# Patient Record
Sex: Female | Born: 1960 | Race: White | Hispanic: No | Marital: Single | Smoking: Former smoker
Health system: Southern US, Community
[De-identification: ages and names within clinical notes are randomized; demographics above are authoritative.]

## PROBLEM LIST (undated history)

## (undated) DIAGNOSIS — K219 Gastro-esophageal reflux disease without esophagitis: Secondary | ICD-10-CM

## (undated) DIAGNOSIS — E785 Hyperlipidemia, unspecified: Secondary | ICD-10-CM

## (undated) DIAGNOSIS — F32A Depression, unspecified: Secondary | ICD-10-CM

## (undated) DIAGNOSIS — F419 Anxiety disorder, unspecified: Secondary | ICD-10-CM

## (undated) DIAGNOSIS — F329 Major depressive disorder, single episode, unspecified: Secondary | ICD-10-CM

## (undated) HISTORY — DX: Gastro-esophageal reflux disease without esophagitis: K21.9

## (undated) HISTORY — DX: Depression, unspecified: F32.A

## (undated) HISTORY — DX: Anxiety disorder, unspecified: F41.9

## (undated) HISTORY — DX: Hyperlipidemia, unspecified: E78.5

## (undated) HISTORY — DX: Major depressive disorder, single episode, unspecified: F32.9

---

## 1998-05-20 DIAGNOSIS — F419 Anxiety disorder, unspecified: Secondary | ICD-10-CM | POA: Insufficient documentation

## 2005-05-20 DIAGNOSIS — F32A Depression, unspecified: Secondary | ICD-10-CM | POA: Insufficient documentation

## 2005-05-20 DIAGNOSIS — F329 Major depressive disorder, single episode, unspecified: Secondary | ICD-10-CM | POA: Insufficient documentation

## 2006-01-14 DIAGNOSIS — G4700 Insomnia, unspecified: Secondary | ICD-10-CM | POA: Insufficient documentation

## 2006-04-08 DIAGNOSIS — N63 Unspecified lump in unspecified breast: Secondary | ICD-10-CM | POA: Insufficient documentation

## 2006-04-09 ENCOUNTER — Ambulatory Visit: Payer: Self-pay | Admitting: Family Medicine

## 2008-05-24 IMAGING — US ULTRASOUND RIGHT BREAST
1 series · 9 of 9 positions shown · non-contrast
Comparison: none

REASON FOR EXAM: New found RT breast nodule Baseline US if needed
COMMENTS:

[Series 1: ultrasound right breast · 9 of 9 slices shown]
[im 1/9]
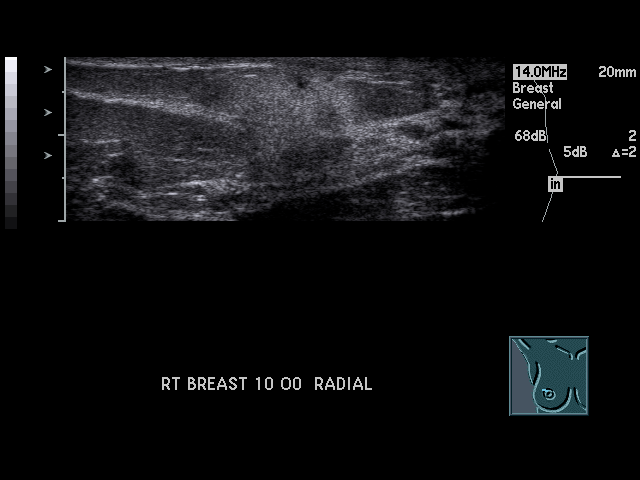
[im 2/9]
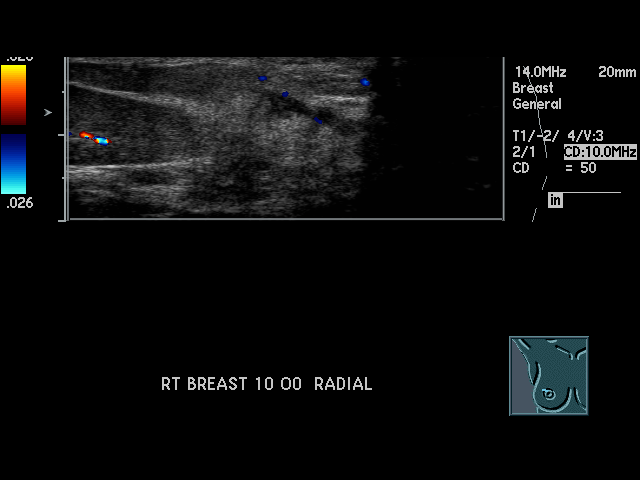
[im 3/9]
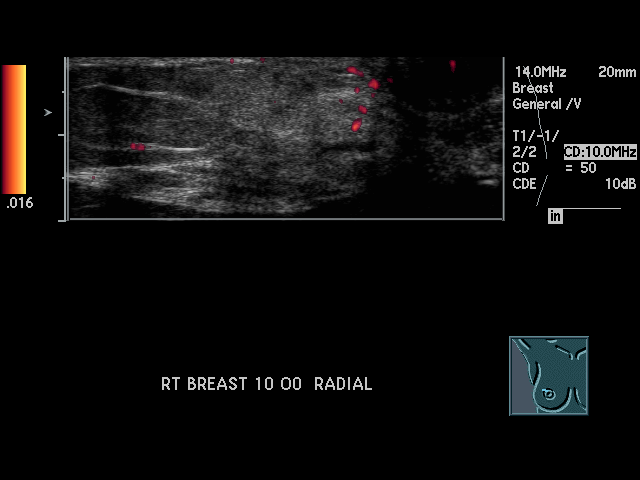
[im 4/9]
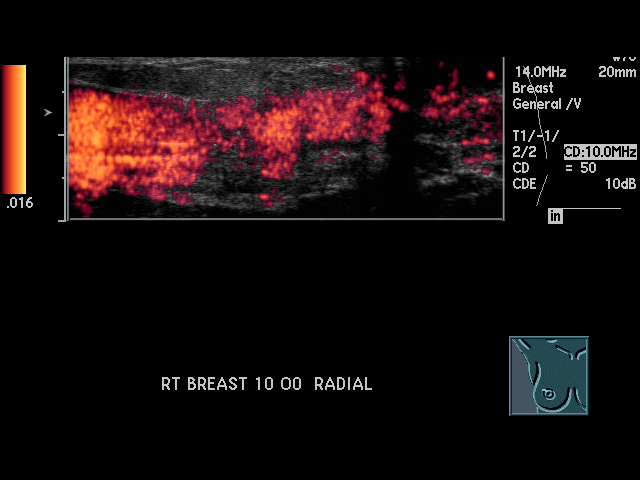
[im 5/9]
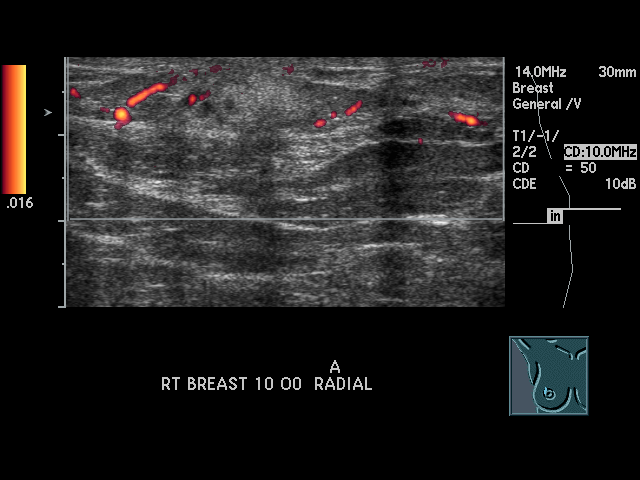
[im 6/9]
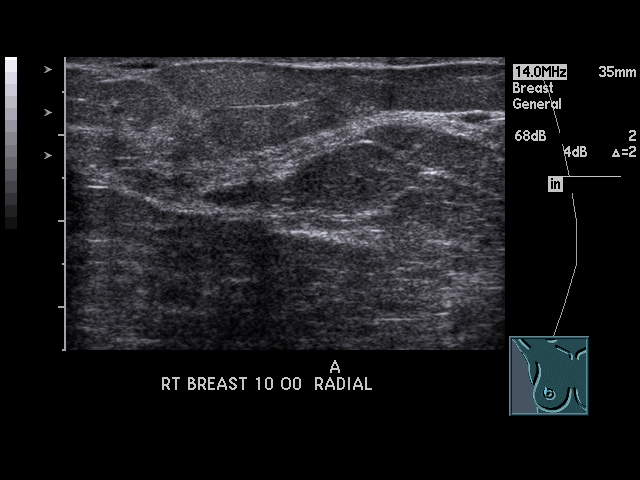
[im 7/9]
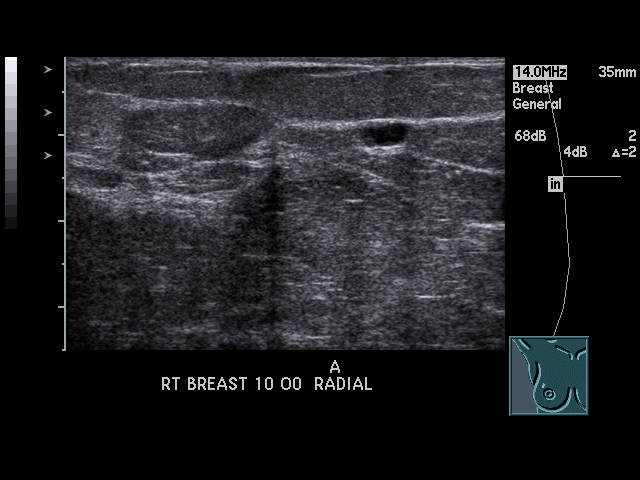
[im 8/9]
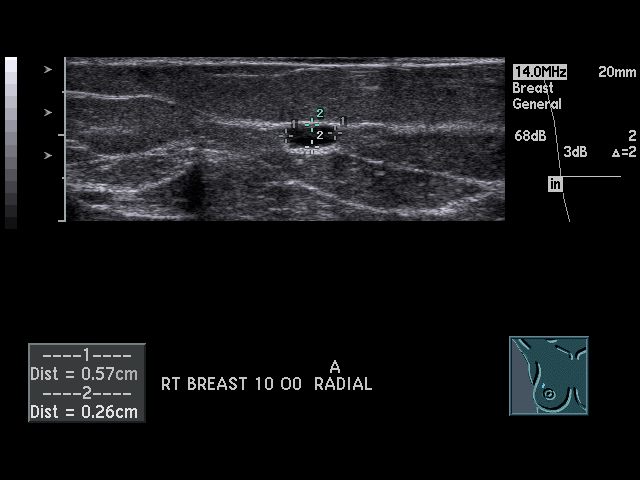
[im 9/9]
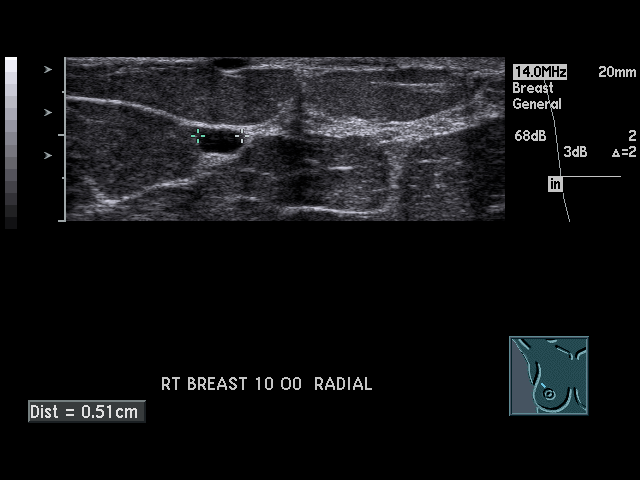

[9 of 9 positions shown; findings below may reference images not displayed]

PROCEDURE:     US  - US BREAST RIGHT  - April 09, 2006  [DATE]

RESULT:     No dominant masses or pathologic clustered calcifications are
demonstrated.  A nodular parenchymal pattern is present.  Multiple small
nodules are present throughout both breasts.  Compression spot films in the
region of palpable reveal no focal abnormality.  Ultrasound was performed
and reveals a tiny simple cyst measuring 6 mm at 10 o'clock in the RIGHT
breast.  No solid lesions are identified.  There is mild increase
echogenicity in the region of palpable abnormality at 10 o'clock in the
RIGHT breast.  Given that there is a palpable abnormality in this region and
that there is increased echogenicity, surgical consultation and biopsy of
the palpable nodule is suggested.
IMPRESSION: No mammographic abnormality identified.

Ultrasound reveals a slight increased echogenicity at 10 o'clock in the
RIGHT breast in the region of palpable abnormality.

There is also adjacent very tiny simple cyst.  Given the presence of a
palpable abnormality and increased echogenicity in this region by
ultrasound, surgical consultation and biopsy is suggested.

BI-RADS 4 - Suspicious Abnormality.

## 2008-12-13 DIAGNOSIS — Z8249 Family history of ischemic heart disease and other diseases of the circulatory system: Secondary | ICD-10-CM | POA: Insufficient documentation

## 2012-09-16 ENCOUNTER — Encounter: Payer: Self-pay | Admitting: Psychiatry

## 2014-08-18 LAB — BASIC METABOLIC PANEL
BUN: 14 mg/dL (ref 4–21)
CREATININE: 0.7 mg/dL (ref 0.5–1.1)
Glucose: 111 mg/dL
Potassium: 4.6 mmol/L (ref 3.4–5.3)
Sodium: 140 mmol/L (ref 137–147)

## 2014-08-18 LAB — LIPID PANEL
Cholesterol: 239 mg/dL — AB (ref 0–200)
HDL: 71 mg/dL — AB (ref 35–70)
LDL Cholesterol: 142 mg/dL
TRIGLYCERIDES: 128 mg/dL (ref 40–160)

## 2014-12-12 ENCOUNTER — Other Ambulatory Visit: Payer: Self-pay | Admitting: Family Medicine

## 2015-02-14 ENCOUNTER — Other Ambulatory Visit: Payer: Self-pay | Admitting: Family Medicine

## 2015-02-14 NOTE — Telephone Encounter (Signed)
Please call in lorazepam.  

## 2015-02-15 NOTE — Telephone Encounter (Signed)
Rx called in to pharmacy. 

## 2015-05-10 ENCOUNTER — Ambulatory Visit (INDEPENDENT_AMBULATORY_CARE_PROVIDER_SITE_OTHER): Payer: Self-pay | Admitting: Family Medicine

## 2015-05-10 ENCOUNTER — Encounter: Payer: Self-pay | Admitting: Family Medicine

## 2015-05-10 VITALS — BP 144/90 | HR 100 | Temp 98.7°F | Resp 16 | Wt 264.0 lb

## 2015-05-10 DIAGNOSIS — E782 Mixed hyperlipidemia: Secondary | ICD-10-CM | POA: Insufficient documentation

## 2015-05-10 DIAGNOSIS — R030 Elevated blood-pressure reading, without diagnosis of hypertension: Secondary | ICD-10-CM | POA: Insufficient documentation

## 2015-05-10 DIAGNOSIS — M6740 Ganglion, unspecified site: Secondary | ICD-10-CM | POA: Insufficient documentation

## 2015-05-10 DIAGNOSIS — R200 Anesthesia of skin: Secondary | ICD-10-CM

## 2015-05-10 DIAGNOSIS — R202 Paresthesia of skin: Secondary | ICD-10-CM

## 2015-05-10 DIAGNOSIS — Z780 Asymptomatic menopausal state: Secondary | ICD-10-CM

## 2015-05-10 MED ORDER — ATORVASTATIN CALCIUM 20 MG PO TABS: 20.0000 mg | ORAL_TABLET | Freq: Every day | ORAL | Status: DC

## 2015-05-10 MED ORDER — QUETIAPINE FUMARATE 25 MG PO TABS: 25.0000 mg | ORAL_TABLET | Freq: Every evening | ORAL | Status: DC | PRN

## 2015-05-10 MED ORDER — LORAZEPAM 2 MG PO TABS: ORAL_TABLET | ORAL | Status: DC

## 2015-05-10 NOTE — Patient Instructions (Signed)
OTC Mucinex or guaifenesin for chest congestion and cough OTC Aleve 2 tablets twice a day as needed for inflammation in wrist Call for orthopedic referral if wrist gets any worse.

## 2015-05-10 NOTE — Progress Notes (Signed)
Patient: Judith Palmer Female    DOB: 1961-03-20   54 y.o.   MRN: [id number] Visit Date: 05/10/2015  Today's Provider: Mila Merry, MD   Chief Complaint  Patient presents with  . Wrist Pain    right   . Menstrual Problem  . Cough  . Wheezing  . Shortness of Breath   Subjective:     Patient had carpel tunnel 12 or 15 years ago with a calcium deposit on her right wrist. Calcium deposit burst and pain in wrist went away until 6 months ago. Right wrist is hurting off and on. Right wrist has 2 knots on the underside.  Has had a cough for 4 days. Symptoms include dry cough, wheezing, sob and sinus drainage.  Menstrual cycle has become unpredictable, comes every few months.    Wrist Pain  The pain is present in the right wrist and right arm. This is a recurrent problem. The current episode started more than 1 month ago (6 month ago). There has been no history of extremity trauma. The problem occurs intermittently. The problem has been unchanged. The quality of the pain is described as aching. The pain is at a severity of 6/10. The pain is moderate. Associated symptoms include a fever, numbness and tingling. Pertinent negatives include no inability to bear weight, joint locking, joint swelling, limited range of motion or stiffness. The symptoms are aggravated by activity. She has tried rest for the symptoms. The treatment provided moderate relief.  Cough This is a new problem. The current episode started in the past 7 days (4 days ago). The problem has been gradually worsening. The problem occurs every few minutes. The cough is productive of sputum. Associated symptoms include a fever, headaches, nasal congestion, postnasal drip, shortness of breath and wheezing. Pertinent negatives include no chest pain, chills, ear congestion, ear pain or sore throat. The symptoms are aggravated by exercise and lying down. She has tried OTC cough suppressant (mucinex) for the symptoms. The treatment  provided mild relief.    Has history of carpal tunnel syndrome. Has been having episodes of numbness of right 1st, 2nd, and third digits. Symptoms resolved at that time resolved with a change in occupation. Knots appeared about 6 months ago. No wrist pain, but numbness worsens when she rotates wrist around.    Irregular periods States periods have been less frequent and less predictable. Has been having severe hot flashes.  Allergies  Allergen Reactions  . Codeine    Previous Medications   ATORVASTATIN (LIPITOR) 20 MG TABLET    Take 1 tablet by mouth daily.   LORAZEPAM (ATIVAN) 2 MG TABLET    TAKE ONE (1) TABLET BY MOUTH TWO (2) TIMES DAILY   QUETIAPINE (SEROQUEL) 25 MG TABLET    TAKE ONE TABLET BY MOUTH AT BEDTIME AS NEEDED    Review of Systems  Constitutional: Positive for fever. Negative for chills, appetite change and fatigue.  HENT: Positive for postnasal drip. Negative for ear pain and sore throat.   Respiratory: Positive for cough, chest tightness, shortness of breath and wheezing.   Cardiovascular: Negative for chest pain and palpitations.  Gastrointestinal: Negative for nausea, vomiting and abdominal pain.  Musculoskeletal: Negative for stiffness.  Neurological: Positive for tingling, numbness and headaches. Negative for dizziness and weakness.    Social History  Substance Use Topics  . Smoking status: Former Games developer  . Smokeless tobacco: Not on file  . Alcohol Use: 0.0 oz/week    0  Standard drinks or equivalent per week     Comment: 1-2 glasses of wine per month   Objective:   BP 144/90 mmHg  Pulse 100  Temp(Src) 98.7 F (37.1 C) (Oral)  Resp 16  Wt 264 lb (119.75 kg)  SpO2 96%  LMP 02/08/2015  Physical Exam  General Appearance:    Alert, cooperative, no distress  HENT:   bilateral TM normal without fluid or infection, neck without nodes, sinuses nontender and nasal mucosa pale and congested  Eyes:    PERRL, conjunctiva/corneas clear, EOM's intact         Lungs:     Clear to auscultation bilaterally, respirations unlabored  Heart:    Regular rate and rhythm  Neurologic:   Awake, alert, oriented x 3. No apparent focal neurological           defect.   MS:   2 small ganglionic cysts on volar aspect of right wrist near head of radius. Mild tenderness in this area. No other swelling. Negative Phalens. l         Assessment & Plan:     1. Numbness and tingling in right hand Likely mild carpal tunnel syndrome. Can try OTC Aleve, icing of ganglionic cysts, and carpal tunnel wrist brace. Advised to call for orthopedic referral if continues to progress.   2. Ganglion cyst Has been getting smaller. Call if these worsen.   3. Menopause Discussed medications management such as HRT and SNRIs. She prefers to avoid prescription medications. Encourage regular daily exercise, stress management. May try OTC Black Cohosh. Call if symptoms worsen.         Mila Merryonald Fisher, MD  [facility]

## 2015-05-12 ENCOUNTER — Encounter: Payer: Self-pay | Admitting: Family Medicine

## 2015-08-10 ENCOUNTER — Other Ambulatory Visit: Payer: Self-pay | Admitting: Family Medicine

## 2015-08-11 NOTE — Telephone Encounter (Signed)
Please call in lorazepam.  

## 2015-08-11 NOTE — Telephone Encounter (Signed)
Rx called in to pharmacy. 

## 2015-10-20 ENCOUNTER — Other Ambulatory Visit: Payer: Self-pay | Admitting: Family Medicine

## 2016-02-28 ENCOUNTER — Other Ambulatory Visit: Payer: Self-pay | Admitting: *Deleted

## 2016-02-28 MED ORDER — LORAZEPAM 2 MG PO TABS: ORAL_TABLET | ORAL | 5 refills | Status: DC

## 2016-02-28 NOTE — Telephone Encounter (Signed)
RX called in at [facility] pharmacy  

## 2016-05-17 ENCOUNTER — Other Ambulatory Visit: Payer: Self-pay | Admitting: Family Medicine

## 2016-07-04 ENCOUNTER — Other Ambulatory Visit: Payer: Self-pay | Admitting: Family Medicine

## 2016-08-08 ENCOUNTER — Other Ambulatory Visit: Payer: Self-pay | Admitting: Family Medicine

## 2016-08-08 NOTE — Telephone Encounter (Signed)
Pt contacted office for refill request on the following medications: atorvastatin (LIPITOR) 20 MG tablet Last Rx: 07/04/16 Pt is scheduled for F/U on Monday 08/12/16. Pt stated that she will run out of the medication Saturday 08/10/16 and Monday is the earliest she can get in the office b/c of her work schedule. Pt doesn't want to run out of the medication. Please advise. Thanks TNP

## 2016-08-08 NOTE — Telephone Encounter (Signed)
Please review. Thanks!  

## 2016-08-09 MED ORDER — ATORVASTATIN CALCIUM 20 MG PO TABS: ORAL_TABLET | ORAL | 0 refills | Status: DC

## 2016-08-12 ENCOUNTER — Encounter: Payer: Self-pay | Admitting: Family Medicine

## 2016-08-12 ENCOUNTER — Ambulatory Visit (INDEPENDENT_AMBULATORY_CARE_PROVIDER_SITE_OTHER): Payer: Self-pay | Admitting: Family Medicine

## 2016-08-12 VITALS — BP 140/90 | HR 89 | Temp 98.3°F | Resp 16 | Wt 267.0 lb

## 2016-08-12 DIAGNOSIS — R030 Elevated blood-pressure reading, without diagnosis of hypertension: Secondary | ICD-10-CM

## 2016-08-12 DIAGNOSIS — E782 Mixed hyperlipidemia: Secondary | ICD-10-CM

## 2016-08-12 NOTE — Progress Notes (Signed)
Patient: Judith Palmer Female    DOB: August 23, 1960   56 y.o.   MRN: [id number] Visit Date: 08/12/2016  Today's Provider: Mila Merry, MD   Chief Complaint  Patient presents with  . Anxiety    follow up  . Blood Pressure Check    follow up  . Hyperlipidemia    follow up  . Insomnia    follow up   Subjective:    HPI Follow up Anxiety:  Patient was last seen for this problem 2 year ago and no changes were made. Patient was advised to continue Lorazepam. Patient reports good compliance with treatment, good tolerance and good symptom control. Patient has been under more stress at work.   Elevated blood pressure, follow-up:  BP Readings from Last 3 Encounters:  05/10/15 (!) 144/90  06/22/14 130/82    She was last seen for hypertension 2 years ago.  BP at that visit was 130/82. Management since that visit includes no changes; continue diet and exercise. She reports good compliance with treatment. She is not having side effects.  She is exercising. She is adherent to low salt diet.   Outside blood pressures are 136/84. She is experiencing none.  Patient denies chest pain, chest pressure/discomfort, claudication, dyspnea, exertional chest pressure/discomfort, fatigue, irregular heart beat, lower extremity edema, near-syncope, orthopnea, palpitations, paroxysmal nocturnal dyspnea, syncope and tachypnea.   Cardiovascular risk factors include dyslipidemia and obesity (BMI >= 30 kg/m2).  Use of agents associated with hypertension: none.     Weight trend: stable Wt Readings from Last 3 Encounters:  05/10/15 264 lb (119.7 kg)  06/22/14 261 lb (118.4 kg)    Current diet: well balanced  ------------------------------------------------------------------------  Lipid/Cholesterol, Follow-up:   Last seen for this 2 years ago.  Management changes since that visit include restarting medications. . Last Lipid Panel:    Component Value Date/Time   CHOL 239 (A)  08/18/2014   TRIG 128 08/18/2014   HDL 71 (A) 08/18/2014   LDLCALC 142 08/18/2014    Risk factors for vascular disease include hypercholesterolemia  She reports good compliance with treatment. She is not having side effects.  Current symptoms include none and have been stable. Weight trend: stable Prior visit with dietician: no Current diet: well balanced Current exercise: walking  Wt Readings from Last 3 Encounters:  05/10/15 264 lb (119.7 kg)  06/22/14 261 lb (118.4 kg)    ------------------------------------------------------------------- Follow up of Insomnia:  Patient was last seen for this problem 2 years ago and no changes were made. Patient reports good compliance with treatment, good tolerance and good symptom control. Seroquel continues to work well and she feels is well tolerated.     Allergies  Allergen Reactions  . Codeine      Current Outpatient Prescriptions:  .  atorvastatin (LIPITOR) 20 MG tablet, TAKE ONE (1) TABLET BY MOUTH EVERY DAY, Disp: 30 tablet, Rfl: 0 .  LORazepam (ATIVAN) 2 MG tablet, TAKE ONE (1) TABLET BY MOUTH TWO (2) TIMES DAILY, Disp: 60 tablet, Rfl: 5 .  QUEtiapine (SEROQUEL) 25 MG tablet, TAKE ONE TABLET BY MOUTH AT BEDTIME AS NEEDED, Disp: 30 tablet, Rfl: 0  Review of Systems  Constitutional: Negative for appetite change, chills, fatigue and fever.  Respiratory: Negative for chest tightness and shortness of breath.   Cardiovascular: Negative for chest pain and palpitations.  Gastrointestinal: Negative for abdominal pain, nausea and vomiting.  Endocrine: Negative for cold intolerance, heat intolerance, polydipsia, polyphagia and polyuria.  Neurological: Negative  for dizziness and weakness.  Psychiatric/Behavioral: The patient is nervous/anxious.     Social History  Substance Use Topics  . Smoking status: Former Games developermoker  . Smokeless tobacco: Never Used  . Alcohol use 0.0 oz/week     Comment: 1-2 glasses of wine per month    Objective:   BP 140/88 (BP Location: Left Arm, Patient Position: Sitting, Cuff Size: Large)   Pulse 89   Temp 98.3 F (36.8 C) (Oral)   Resp 16   Wt 267 lb (121.1 kg)   SpO2 95% Comment: room air  BMI 44.43 kg/m  There were no vitals filed for this visit.   Physical Exam   General Appearance:    Alert, cooperative, no distress  Eyes:    PERRL, conjunctiva/corneas clear, EOM's intact       Lungs:     Clear to auscultation bilaterally, respirations unlabored  Heart:    Regular rate and rhythm  Neurologic:   Awake, alert, oriented x 3. No apparent focal neurological           defect.            Assessment & Plan:     1. Transient elevated blood pressure She is to exercising more to lose weight and continue low sodium diet.   2. Hyperlipidemia, mixed She is tolerating atorvastatin well with no adverse effects.   - Comprehensive metabolic panel - Lipid panel       Mila Merryonald Shontelle Muska, MD  [facility]

## 2016-08-13 LAB — COMPREHENSIVE METABOLIC PANEL
A/G RATIO: 2.1 (ref 1.2–2.2)
ALBUMIN: 4.6 g/dL (ref 3.5–5.5)
ALK PHOS: 76 IU/L (ref 39–117)
ALT: 27 IU/L (ref 0–32)
AST: 16 IU/L (ref 0–40)
BILIRUBIN TOTAL: 0.4 mg/dL (ref 0.0–1.2)
BUN / CREAT RATIO: 23 (ref 9–23)
BUN: 14 mg/dL (ref 6–24)
CHLORIDE: 98 mmol/L (ref 96–106)
CO2: 27 mmol/L (ref 18–29)
Calcium: 9.8 mg/dL (ref 8.7–10.2)
Creatinine, Ser: 0.61 mg/dL (ref 0.57–1.00)
GFR calc non Af Amer: 104 mL/min/{1.73_m2} (ref >59)
GFR, EST AFRICAN AMERICAN: 120 mL/min/{1.73_m2} (ref >59)
Globulin, Total: 2.2 g/dL (ref 1.5–4.5)
Glucose: 100 mg/dL — ABNORMAL HIGH (ref 65–99)
POTASSIUM: 3.9 mmol/L (ref 3.5–5.2)
SODIUM: 141 mmol/L (ref 134–144)
TOTAL PROTEIN: 6.8 g/dL (ref 6.0–8.5)

## 2016-08-13 LAB — LIPID PANEL
CHOLESTEROL TOTAL: 246 mg/dL — AB (ref 100–199)
Chol/HDL Ratio: 3.4 ratio units (ref 0.0–4.4)
HDL: 73 mg/dL (ref >39)
LDL Calculated: 134 mg/dL — ABNORMAL HIGH (ref 0–99)
Triglycerides: 195 mg/dL — ABNORMAL HIGH (ref 0–149)
VLDL Cholesterol Cal: 39 mg/dL (ref 5–40)

## 2016-08-13 NOTE — Progress Notes (Signed)
Advised  ED 

## 2016-09-11 ENCOUNTER — Other Ambulatory Visit: Payer: Self-pay | Admitting: Family Medicine

## 2016-09-12 NOTE — Telephone Encounter (Signed)
Please call in lorazepam.  

## 2016-09-12 NOTE — Telephone Encounter (Signed)
Rx called in to pharmacy. 

## 2017-04-16 ENCOUNTER — Other Ambulatory Visit: Payer: Self-pay | Admitting: Family Medicine

## 2017-10-02 ENCOUNTER — Telehealth: Payer: Self-pay | Admitting: Family Medicine

## 2017-10-02 ENCOUNTER — Other Ambulatory Visit: Payer: Self-pay | Admitting: Family Medicine

## 2017-10-02 NOTE — Telephone Encounter (Signed)
[facility] faxed a refill request for the following medication. Thanks CC  QUEtiapine (SEROQUEL) 25 MG tablet

## 2017-11-12 ENCOUNTER — Other Ambulatory Visit: Payer: Self-pay | Admitting: Family Medicine

## 2017-11-24 ENCOUNTER — Ambulatory Visit
Admission: RE | Admit: 2017-11-24 | Discharge: 2017-11-24 | Disposition: A | Discharge status: Home / Self Care | Payer: Self-pay | Source: Ambulatory Visit | Attending: Family Medicine | Admitting: Family Medicine

## 2017-11-24 ENCOUNTER — Encounter: Payer: Self-pay | Admitting: Family Medicine

## 2017-11-24 ENCOUNTER — Telehealth: Payer: Self-pay

## 2017-11-24 ENCOUNTER — Ambulatory Visit: Payer: Self-pay | Admitting: Family Medicine

## 2017-11-24 VITALS — BP 128/72 | HR 121 | Temp 101.3°F | Resp 20 | Wt 248.0 lb

## 2017-11-24 DIAGNOSIS — R059 Cough, unspecified: Secondary | ICD-10-CM

## 2017-11-24 DIAGNOSIS — R509 Fever, unspecified: Secondary | ICD-10-CM | POA: Insufficient documentation

## 2017-11-24 DIAGNOSIS — R05 Cough: Secondary | ICD-10-CM

## 2017-11-24 LAB — POCT URINALYSIS DIPSTICK
GLUCOSE UA: NEGATIVE
Ketones, UA: 160
Nitrite, UA: NEGATIVE
Protein, UA: POSITIVE — AB
Urobilinogen, UA: 1 E.U./dL
pH, UA: 6 (ref 5.0–8.0)

## 2017-11-24 MED ORDER — LEVOFLOXACIN 750 MG PO TABS
750.0000 mg | ORAL_TABLET | Freq: Every day | ORAL | 0 refills | Status: DC
Start: 2017-11-24 — End: 2017-12-01

## 2017-11-24 MED ORDER — PREDNISONE 20 MG PO TABS: 20.0000 mg | ORAL_TABLET | Freq: Two times a day (BID) | ORAL | 0 refills | Status: AC

## 2017-11-24 NOTE — Telephone Encounter (Signed)
lmtcb

## 2017-11-24 NOTE — Progress Notes (Signed)
Patient: Judith Palmer Female    DOB: 14-May-1961   57 y.o.   MRN: [id number] Visit Date: 11/24/2017  Today's Provider: Mila Merry, MD   Chief Complaint  Patient presents with  . Fever   Subjective:    Fever   This is a new problem. Episode onset: 2 days ago. The problem occurs constantly. The problem has been gradually worsening. Maximum temperature: 102.3. The temperature was taken using an oral thermometer. Associated symptoms include chest pain (when coughing), coughing (dry), diarrhea, muscle aches, nausea and wheezing. Pertinent negatives include no ear pain, headaches, sleepiness, sore throat, urinary pain or vomiting. She has tried acetaminophen (also Furniture conservator/restorer) for the symptoms. The treatment provided mild relief.   No sore throat. No known tick or other insect bites.     Allergies  Allergen Reactions  . Codeine      Current Outpatient Medications:  .  atorvastatin (LIPITOR) 20 MG tablet, TAKE ONE TABLET BY MOUTH EVERY DAY, Disp: 30 tablet, Rfl: 12 .  LORazepam (ATIVAN) 2 MG tablet, TAKE 1 TABLET TWICE A DAY, Disp: 60 tablet, Rfl: 0 .  QUEtiapine (SEROQUEL) 25 MG tablet, TAKE 1 TABLET AT BEDTIME AS NEEDED, Disp: 30 tablet, Rfl: 0  Review of Systems  Constitutional: Positive for chills, diaphoresis, fatigue and fever.  HENT: Negative for ear pain and sore throat.   Respiratory: Positive for cough (dry), shortness of breath and wheezing.   Cardiovascular: Positive for chest pain (when coughing).  Gastrointestinal: Positive for diarrhea and nausea. Negative for vomiting.  Genitourinary: Negative for dysuria.  Musculoskeletal: Positive for back pain and myalgias.  Neurological: Positive for dizziness. Negative for headaches.    Social History   Tobacco Use  . Smoking status: Former Games developer  . Smokeless tobacco: Never Used  Substance Use Topics  . Alcohol use: Yes    Alcohol/week: 0.0 oz    Comment: 1-2 glasses of wine per month   Objective:   BP  128/72 (BP Location: Left Arm, Patient Position: Sitting, Cuff Size: Large)   Pulse (!) 121   Temp (!) 101.3 F (38.5 C) (Oral)   Resp 20   Wt 248 lb (112.5 kg)   SpO2 93% Comment: room air  BMI 41.27 kg/m  Vitals:   11/24/17 1105  BP: 128/72  Pulse: (!) 121  Resp: 20  Temp: (!) 101.3 F (38.5 C)  TempSrc: Oral  SpO2: 93%  Weight: 248 lb (112.5 kg)     Physical Exam   General Appearance:    Alert, cooperative, no distress  Eyes:    PERRL, conjunctiva/corneas clear, EOM's intact       Lungs:     Clear to auscultation bilaterally, respirations unlabored  Heart:    Regular rate and rhythm  Neurologic:   Awake, alert, oriented x 3. No apparent focal neurological           defect.      Chest XR: Mild diffuse interstitial densities are noted throughout both lungs which may represent scarring or atypical inflammation.    Assessment & Plan:     1. Cough  - DG Chest 2 View; Future - levofloxacin (LEVAQUIN) 750 MG tablet; Take 1 tablet (750 mg total) by mouth daily.  Dispense: 7 tablet; Refill: 0  2. Fever, unspecified fever cause  - DG Chest 2 View; Future - POCT Urinalysis Dipstick - levofloxacin (LEVAQUIN) 750 MG tablet; Take 1 tablet (750 mg total) by mouth daily.  Dispense: 7  tablet; Refill: 0 - predniSONE (DELTASONE) 20 MG tablet; Take 1 tablet (20 mg total) by mouth 2 (two) times daily with a meal for 5 days.  Dispense: 10 tablet; Refill: 0  Call if symptoms change or if not rapidly improving.          Mila Merryonald Fisher, MD  [facility]

## 2017-11-24 NOTE — Patient Instructions (Signed)
Go to the [facility] on [address] for chest Xray  

## 2017-11-24 NOTE — Telephone Encounter (Signed)
-----   Message from Malva Limesonald E Fisher, MD sent at 11/24/2017  5:11 PM EDT ----- Herby AbrahamXray shows diffuse inflammation of lungs, probably a viral pneumonia. Have sent prescription for levofloxacin and prednisone. Should feel much better in 3-4 days. Call if getting any worse or if not much better by end of week.

## 2017-11-25 NOTE — Telephone Encounter (Signed)
Patient advised as below. Patient verbalizes understanding and is in agreement with treatment plan.  

## 2017-11-25 NOTE — Telephone Encounter (Signed)
lmtcb

## 2018-01-07 ENCOUNTER — Other Ambulatory Visit: Payer: Self-pay | Admitting: Family Medicine

## 2018-01-12 ENCOUNTER — Other Ambulatory Visit: Payer: Self-pay | Admitting: Family Medicine

## 2018-03-27 ENCOUNTER — Other Ambulatory Visit: Payer: Self-pay | Admitting: Family Medicine

## 2018-07-03 ENCOUNTER — Other Ambulatory Visit: Payer: Self-pay | Admitting: Family Medicine

## 2018-07-03 NOTE — Telephone Encounter (Signed)
[facility] faxed refill request for the following medications:  atorvastatin (LIPITOR) 20 MG tablet   Please advise.

## 2018-07-06 MED ORDER — ATORVASTATIN CALCIUM 20 MG PO TABS: 20.0000 mg | ORAL_TABLET | Freq: Every day | ORAL | 1 refills | Status: DC

## 2018-08-03 ENCOUNTER — Other Ambulatory Visit: Payer: Self-pay | Admitting: Family Medicine

## 2018-09-07 ENCOUNTER — Other Ambulatory Visit: Payer: Self-pay | Admitting: *Deleted

## 2018-09-07 MED ORDER — ATORVASTATIN CALCIUM 20 MG PO TABS: 20.0000 mg | ORAL_TABLET | Freq: Every day | ORAL | 1 refills | Status: DC

## 2018-09-07 NOTE — Telephone Encounter (Signed)
Patient was advised she is past due for follow up appt. Patient stated she doesn't want to come into office right now and will schedule sn appt in a couple of months.

## 2018-09-07 NOTE — Telephone Encounter (Signed)
Is it ok to refill medication? 

## 2018-12-07 ENCOUNTER — Other Ambulatory Visit: Payer: Self-pay | Admitting: Family Medicine

## 2018-12-07 DIAGNOSIS — R05 Cough: Secondary | ICD-10-CM

## 2018-12-07 DIAGNOSIS — R059 Cough, unspecified: Secondary | ICD-10-CM

## 2018-12-07 DIAGNOSIS — R509 Fever, unspecified: Secondary | ICD-10-CM

## 2019-01-18 ENCOUNTER — Encounter: Payer: Self-pay | Admitting: Family Medicine

## 2019-01-18 ENCOUNTER — Ambulatory Visit (INDEPENDENT_AMBULATORY_CARE_PROVIDER_SITE_OTHER): Payer: Self-pay | Admitting: Family Medicine

## 2019-01-18 VITALS — BP 137/84

## 2019-01-18 DIAGNOSIS — R030 Elevated blood-pressure reading, without diagnosis of hypertension: Secondary | ICD-10-CM

## 2019-01-18 DIAGNOSIS — E782 Mixed hyperlipidemia: Secondary | ICD-10-CM

## 2019-01-18 DIAGNOSIS — F419 Anxiety disorder, unspecified: Secondary | ICD-10-CM

## 2019-01-18 DIAGNOSIS — G4700 Insomnia, unspecified: Secondary | ICD-10-CM

## 2019-01-18 NOTE — Progress Notes (Addendum)
Patient: Judith NumbersDebra L Palmer Female    DOB: [birth year]   58 y.o.   MRN: [id number] Visit Date: 01/18/2019  Today's Provider: Mila Merryonald Quartez Lag[place], MD   Chief Complaint  Patient presents with  . Hyperlipidemia   Subjective:    Virtual Visit via Video Note  Virtual Visit via Video Note  I connected with Judith Numbersebra L Palmer on 01/18/19 at  8:00 AM EDT by a video enabled telemedicine application and verified that I am speaking with the correct person using two identifiers.   I discussed the limitations of evaluation and management by telemedicine and the availability of in person appointments. The patient expressed understanding and agreed to proceed.    The patient was advised to call back or seek an in-person evaluation if the symptoms worsen or if the condition fails to improve as anticipated.  I provided 10 minutes of non-face-to-face time during this encounter.    HPI   Lipid/Cholesterol, Follow-up:   Last seen for this2 years ago.  Management changes since that visit include none. . Last Lipid Panel:    Component Value Date/Time   CHOL 246 (H) 08/12/2016 1428   TRIG 195 (H) 08/12/2016 1428   HDL 73 08/12/2016 1428   CHOLHDL 3.4 08/12/2016 1428   LDLCALC 134 (H) 08/12/2016 1428    Risk factors for vascular disease include hypercholesterolemia  She reports good compliance with treatment. She is not having side effects.  Current symptoms include none and have been stable. Weight trend: weight not checked Prior visit with dietician: no Current diet: in general, an "unhealthy" diet Current exercise: yard work  J[employer] Chase & CoWt Readings from Last 3 Encounters:  11/24/17 248 lb (112.5 kg)  08/12/16 267 lb (121.1 kg)  05/10/15 264 lb (119.7 kg)    ------------------------------------------------------------------- Follow up elevated blood pressures: She reports she has been avoid sodium in diet and check BP with wrist monitor, and is usually in the mid 130s/mid 80s. Feels well. No  chest pain, palpitations, dyspnea.   She reports she usually takes lorazepam just once a day in the evenings, rarely needs to take more than that. Takes Seroquel occasionally to help sleep, and is effective, but makes her groggy the next day.   Allergies  Allergen Reactions  . Codeine      Current Outpatient Medications:  .  atorvastatin (LIPITOR) 20 MG tablet, Take 1 tablet (20 mg total) by mouth daily., Disp: 30 tablet, Rfl: 0 .  LORazepam (ATIVAN) 2 MG tablet, TAKE 1 TABLET TWICE A DAY, Disp: 60 tablet, Rfl: 3 .  QUEtiapine (SEROQUEL) 25 MG tablet, TAKE 1 TABLET AT BEDTIME AS NEEDED, Disp: 30 tablet, Rfl: 12  Review of Systems  Constitutional: Negative for appetite change, chills, fatigue and fever.  Respiratory: Negative for chest tightness and shortness of breath.   Cardiovascular: Negative for chest pain and palpitations.  Gastrointestinal: Negative for abdominal pain, nausea and vomiting.  Neurological: Negative for dizziness and weakness.    Social History   Tobacco Use  . Smoking status: Former Games developermoker  . Smokeless tobacco: Never Used  Substance Use Topics  . Alcohol use: Yes    Alcohol/week: 0.0 standard drinks    Comment: 1-2 glasses of wine per month      Objective:   BP 137/84  Vitals:   01/18/19 0809  BP: 137/84  There is no height or weight on file to calculate BMI.   Physical Exam  General appearance: alert, well developed, well nourished, cooperative and in  no distress Head: Normocephalic, without obvious abnormality, atraumatic Respiratory: Respirations even and unlabored, normal respiratory rate Psych: Appropriate mood and affect. Neurologic: Mental status: Alert, oriented to person, place, and time, thought content appropriate.     Assessment & Plan    1. Hyperlipidemia, mixed She is tolerating atorvastatin well with no adverse effects.    2. Transient elevated blood pressure Home BP satisfactory. On low sodium diet.   3. Insomnia,  unspecified type Does well with occasional Seroquel.   4. Anxiety Continue lorazepam prn.      Lelon Huh, MD  [facility]

## 2019-01-18 NOTE — Patient Instructions (Signed)
.   Please review the attached list of medications and notify my office if there are any errors.   . Please bring all of your medications to every appointment so we can make sure that our medication list is the same as yours.   . It is especially important to get the annual flu vaccine this year. If you haven't had it already, please go to your pharmacy or call the office as soon as possible to schedule you flu shot.  

## 2019-01-27 ENCOUNTER — Other Ambulatory Visit: Payer: Self-pay | Admitting: Family Medicine

## 2019-02-26 ENCOUNTER — Telehealth: Payer: Self-pay

## 2019-02-26 LAB — COMPREHENSIVE METABOLIC PANEL
ALT: 34 IU/L — ABNORMAL HIGH (ref 0–32)
AST: 20 IU/L (ref 0–40)
Albumin/Globulin Ratio: 2.1 (ref 1.2–2.2)
Albumin: 4.7 g/dL (ref 3.8–4.9)
Alkaline Phosphatase: 82 IU/L (ref 39–117)
BUN/Creatinine Ratio: 23 (ref 9–23)
BUN: 17 mg/dL (ref 6–24)
Bilirubin Total: 0.5 mg/dL (ref 0.0–1.2)
CO2: 23 mmol/L (ref 20–29)
Calcium: 9.3 mg/dL (ref 8.7–10.2)
Chloride: 104 mmol/L (ref 96–106)
Creatinine, Ser: 0.74 mg/dL (ref 0.57–1.00)
GFR calc Af Amer: 105 mL/min/{1.73_m2} (ref >59)
GFR calc non Af Amer: 91 mL/min/{1.73_m2} (ref >59)
Globulin, Total: 2.2 g/dL (ref 1.5–4.5)
Glucose: 101 mg/dL — ABNORMAL HIGH (ref 65–99)
Potassium: 4 mmol/L (ref 3.5–5.2)
Sodium: 141 mmol/L (ref 134–144)
Total Protein: 6.9 g/dL (ref 6.0–8.5)

## 2019-02-26 LAB — LIPID PANEL
Chol/HDL Ratio: 3.2 ratio (ref 0.0–4.4)
Cholesterol, Total: 237 mg/dL — ABNORMAL HIGH (ref 100–199)
HDL: 74 mg/dL (ref >39)
LDL Chol Calc (NIH): 141 mg/dL — ABNORMAL HIGH (ref 0–99)
Triglycerides: 125 mg/dL (ref 0–149)
VLDL Cholesterol Cal: 22 mg/dL (ref 5–40)

## 2019-02-26 NOTE — Telephone Encounter (Signed)
Pt advised.   Thanks,   -Laura  

## 2019-02-26 NOTE — Telephone Encounter (Signed)
-----   Message from Birdie Sons, MD sent at 02/26/2019  8:46 AM EDT ----- Cholesterol is stable at 237, normal kidney liver functions and electrolytes, Continue current medications.  Check yearly.

## 2019-03-16 ENCOUNTER — Other Ambulatory Visit: Payer: Self-pay | Admitting: Family Medicine

## 2019-06-18 ENCOUNTER — Telehealth: Payer: Self-pay

## 2019-06-18 NOTE — Telephone Encounter (Signed)
Copied from CRM [id number]. Topic: General - Other >> Jun 18, 2019  1:31 PM Laural Benes, [place] C wrote: Reason for CRM: pt called in to schedule apt with PCP for side pain. Pt would like to know if she could be seen on Monday? If possible, could PCP see pt sooner than next available showing?    CB: [phone]

## 2019-06-18 NOTE — Telephone Encounter (Signed)
Scheduled appt.

## 2019-06-21 ENCOUNTER — Encounter: Payer: Self-pay | Admitting: Family Medicine

## 2019-06-21 ENCOUNTER — Other Ambulatory Visit: Payer: Self-pay

## 2019-06-21 ENCOUNTER — Ambulatory Visit (INDEPENDENT_AMBULATORY_CARE_PROVIDER_SITE_OTHER): Payer: Self-pay | Admitting: Family Medicine

## 2019-06-21 VITALS — BP 142/80 | HR 86 | Temp 97.1°F | Resp 18 | Wt 272.0 lb

## 2019-06-21 DIAGNOSIS — M545 Low back pain, unspecified: Secondary | ICD-10-CM

## 2019-06-21 DIAGNOSIS — R102 Pelvic and perineal pain: Secondary | ICD-10-CM

## 2019-06-21 LAB — POCT URINALYSIS DIPSTICK
Bilirubin, UA: NEGATIVE
Glucose, UA: NEGATIVE
Ketones, UA: NEGATIVE
Leukocytes, UA: NEGATIVE
Nitrite, UA: NEGATIVE
Protein, UA: NEGATIVE
Spec Grav, UA: >=1.03 — AB (ref 1.010–1.025)
Urobilinogen, UA: 0.2 E.U./dL
pH, UA: 6 (ref 5.0–8.0)

## 2019-06-21 NOTE — Progress Notes (Signed)
Patient: Judith Palmer Female    DOB: Feb 28, 1961   59 y.o.   MRN: [id number] Visit Date: 06/21/2019  Today's Provider: Mila Merry, MD   Chief Complaint  Patient presents with  . Back Pain  . Abdominal Pain   Subjective:     Back Pain This is a new problem. Episode onset: 2-3 months ago; started after raking leaves. The problem occurs intermittently. The problem has been waxing and waning since onset. The pain is present in the lumbar spine (left side). Quality: sharp pain. Radiates to: left lower quadrant. Associated symptoms include abdominal pain (LLQ). Pertinent negatives include no chest pain, fever or weakness.  Started a few days after raking leaves a few months ago, but radiates into left groin at times. No OTC medications.   Allergies  Allergen Reactions  . Codeine      Current Outpatient Medications:  .  atorvastatin (LIPITOR) 20 MG tablet, Take 1 tablet (20 mg total) by mouth daily., Disp: 30 tablet, Rfl: 4 .  LORazepam (ATIVAN) 2 MG tablet, TAKE 1 TABLET TWICE A DAY, Disp: 60 tablet, Rfl: 3 .  QUEtiapine (SEROQUEL) 25 MG tablet, TAKE 1 TABLET AT BEDTIME AS NEEDED, Disp: 30 tablet, Rfl: 5  Review of Systems  Constitutional: Negative for appetite change, chills, fatigue and fever.  Respiratory: Negative for chest tightness and shortness of breath.   Cardiovascular: Negative for chest pain and palpitations.  Gastrointestinal: Positive for abdominal pain (LLQ). Negative for nausea and vomiting.  Musculoskeletal: Positive for back pain.  Neurological: Negative for dizziness and weakness.    Social History   Tobacco Use  . Smoking status: Former Games developer  . Smokeless tobacco: Never Used  Substance Use Topics  . Alcohol use: Yes    Alcohol/week: 0.0 standard drinks    Comment: 1-2 glasses of wine per month      Objective:   BP (!) 142/80 (BP Location: Right Arm, Patient Position: Sitting, Cuff Size: Large)   Pulse 86   Temp (!) 97.1 F (36.2 C)  (Temporal)   Resp 18   Wt 272 lb (123.4 kg)   SpO2 98% Comment: room air  BMI 45.26 kg/m  Vitals:   06/21/19 1507 06/21/19 1511  BP: 140/80 (!) 142/80  Pulse: 86   Resp: 18   Temp: (!) 97.1 F (36.2 C)   TempSrc: Temporal   SpO2: 98%   Weight: 272 lb (123.4 kg)   Body mass index is 45.26 kg/m.   Physical Exam  General Appearance:    Obese female, alert, cooperative, in no acute distress  Eyes:    PERRL, conjunctiva/corneas clear, EOM's intact       Lungs:     Clear to auscultation bilaterally, respirations unlabored  Heart:    Normal heart rate. Normal rhythm. No murmurs, rubs, or gallops.   Abdomen:   bowel sounds present and normal in all 4 quadrants, soft, nondistended or mild Left lower quadrant and left suprapubic tenderness.      Results for orders placed or performed in visit on 06/21/19  POCT Urinalysis Dipstick  Result Value Ref Range   Color, UA yellow    Clarity, UA cloudy    Glucose, UA Negative Negative   Bilirubin, UA negative    Ketones, UA negative    Spec Grav, UA >=1.030 (A) 1.010 - 1.025   Blood, UA Trace (non-hemolyzed)    pH, UA 6.0 5.0 - 8.0   Protein, UA Negative Negative  Urobilinogen, UA 0.2 0.2 or 1.0 E.U./dL   Nitrite, UA negative    Leukocytes, UA Negative Negative   Appearance     Odor         Assessment & Plan    1. Acute left-sided low back pain, unspecified whether sciatica present   2. Pelvic pain  - US Pelvis Complete; Future     Lelon Huh, MD  [facility]

## 2019-06-23 ENCOUNTER — Other Ambulatory Visit: Payer: Self-pay | Admitting: Family Medicine

## 2019-06-23 DIAGNOSIS — R102 Pelvic and perineal pain: Secondary | ICD-10-CM

## 2019-06-28 ENCOUNTER — Ambulatory Visit
Admission: RE | Admit: 2019-06-28 | Discharge: 2019-06-28 | Disposition: A | Discharge status: Home / Self Care | Payer: Self-pay | Source: Ambulatory Visit | Attending: Family Medicine | Admitting: Family Medicine

## 2019-06-28 ENCOUNTER — Other Ambulatory Visit: Payer: Self-pay

## 2019-06-28 DIAGNOSIS — R102 Pelvic and perineal pain: Secondary | ICD-10-CM | POA: Insufficient documentation

## 2019-07-01 ENCOUNTER — Telehealth: Payer: Self-pay

## 2019-07-01 NOTE — Telephone Encounter (Signed)
-----   Message from Malva Limes, MD sent at 07/01/2019  8:23 AM EST ----- Ultrasound shows several small fibroids, ovaries are normal. Nothing concerning that would explain her pain.  Considering she has never had a colonoscopy and is due for one, I would recommend referral to GI. Pain could be due to polyp or diverticulosis in her colon.

## 2019-07-01 NOTE — Telephone Encounter (Signed)
Patient advised. She declined a colonoscopy at this time due to no insurance.

## 2019-08-07 ENCOUNTER — Ambulatory Visit: Payer: Self-pay | Attending: Internal Medicine

## 2019-08-07 DIAGNOSIS — Z23 Encounter for immunization: Secondary | ICD-10-CM

## 2019-08-07 NOTE — Progress Notes (Signed)
   Covid-19 Vaccination Clinic  Name:  Judith Palmer    MRN: [id number] DOB: 08-04-1960  08/07/2019  [name] was observed post Covid-19 immunization for 15 minutes without incident. She was provided with Vaccine Information Sheet and instruction to access the V-Safe system.   [name] was instructed to call 911 with any severe reactions post vaccine: Marland Kitchen Difficulty breathing  . Swelling of face and throat  . A fast heartbeat  . A bad rash all over body  . Dizziness and weakness   Immunizations Administered    Name Date Dose VIS Date Route   Pfizer COVID-19 Vaccine 08/07/2019  1:12 PM 0.3 mL 04/30/2019 Intramuscular   Manufacturer: [employer], [employer]   Lot: [id number]   NDC: [address]-9

## 2019-08-31 ENCOUNTER — Ambulatory Visit: Payer: Self-pay | Attending: Internal Medicine

## 2019-08-31 DIAGNOSIS — Z23 Encounter for immunization: Secondary | ICD-10-CM

## 2019-08-31 NOTE — Progress Notes (Signed)
   Covid-19 Vaccination Clinic  Name:  DAROTHY COURTRIGHT    MRN: [id number] DOB: 08-Mar-1961  08/31/2019  Ms. Petrucci was observed post Covid-19 immunization for 15 minutes without incident. She was provided with Vaccine Information Sheet and instruction to access the V-Safe system.   Ms. Dearinger was instructed to call 911 with any severe reactions post vaccine: Marland Kitchen Difficulty breathing  . Swelling of face and throat  . A fast heartbeat  . A bad rash all over body  . Dizziness and weakness   Immunizations Administered    Name Date Dose VIS Date Route   Pfizer COVID-19 Vaccine 08/31/2019  2:07 PM 0.3 mL 04/30/2019 Intramuscular   Manufacturer: [employer], [employer]   Lot: [id number]   NDC: [address]-2

## 2019-09-03 ENCOUNTER — Other Ambulatory Visit: Payer: Self-pay | Admitting: Family Medicine

## 2019-09-03 ENCOUNTER — Encounter: Payer: Self-pay | Admitting: Family Medicine

## 2019-09-03 MED ORDER — LORAZEPAM 2 MG PO TABS: 2.0000 mg | ORAL_TABLET | Freq: Two times a day (BID) | ORAL | 3 refills | Status: DC

## 2019-09-03 NOTE — Telephone Encounter (Signed)
[facility] faxed refill request for the following medications:  LORazepam (ATIVAN) 2 MG tablet   Please advise.  Thanks, [employer]

## 2019-09-03 NOTE — Addendum Note (Signed)
Addended by: Malva Limes on: 09/03/2019 04:25 PM   Modules accepted: Orders

## 2019-11-19 ENCOUNTER — Other Ambulatory Visit: Payer: Self-pay | Admitting: Family Medicine

## 2020-01-09 IMAGING — CR DG CHEST 2V
1 series · 2 of 2 positions shown · non-contrast
Comparison: None.

CLINICAL DATA: Cough, fever, chest pain.

EXAM:
CHEST - 2 VIEW

[Series 1: dg chest 2 view · 0.14mm/px · 2 of 2 slices shown]
[im 1/2]
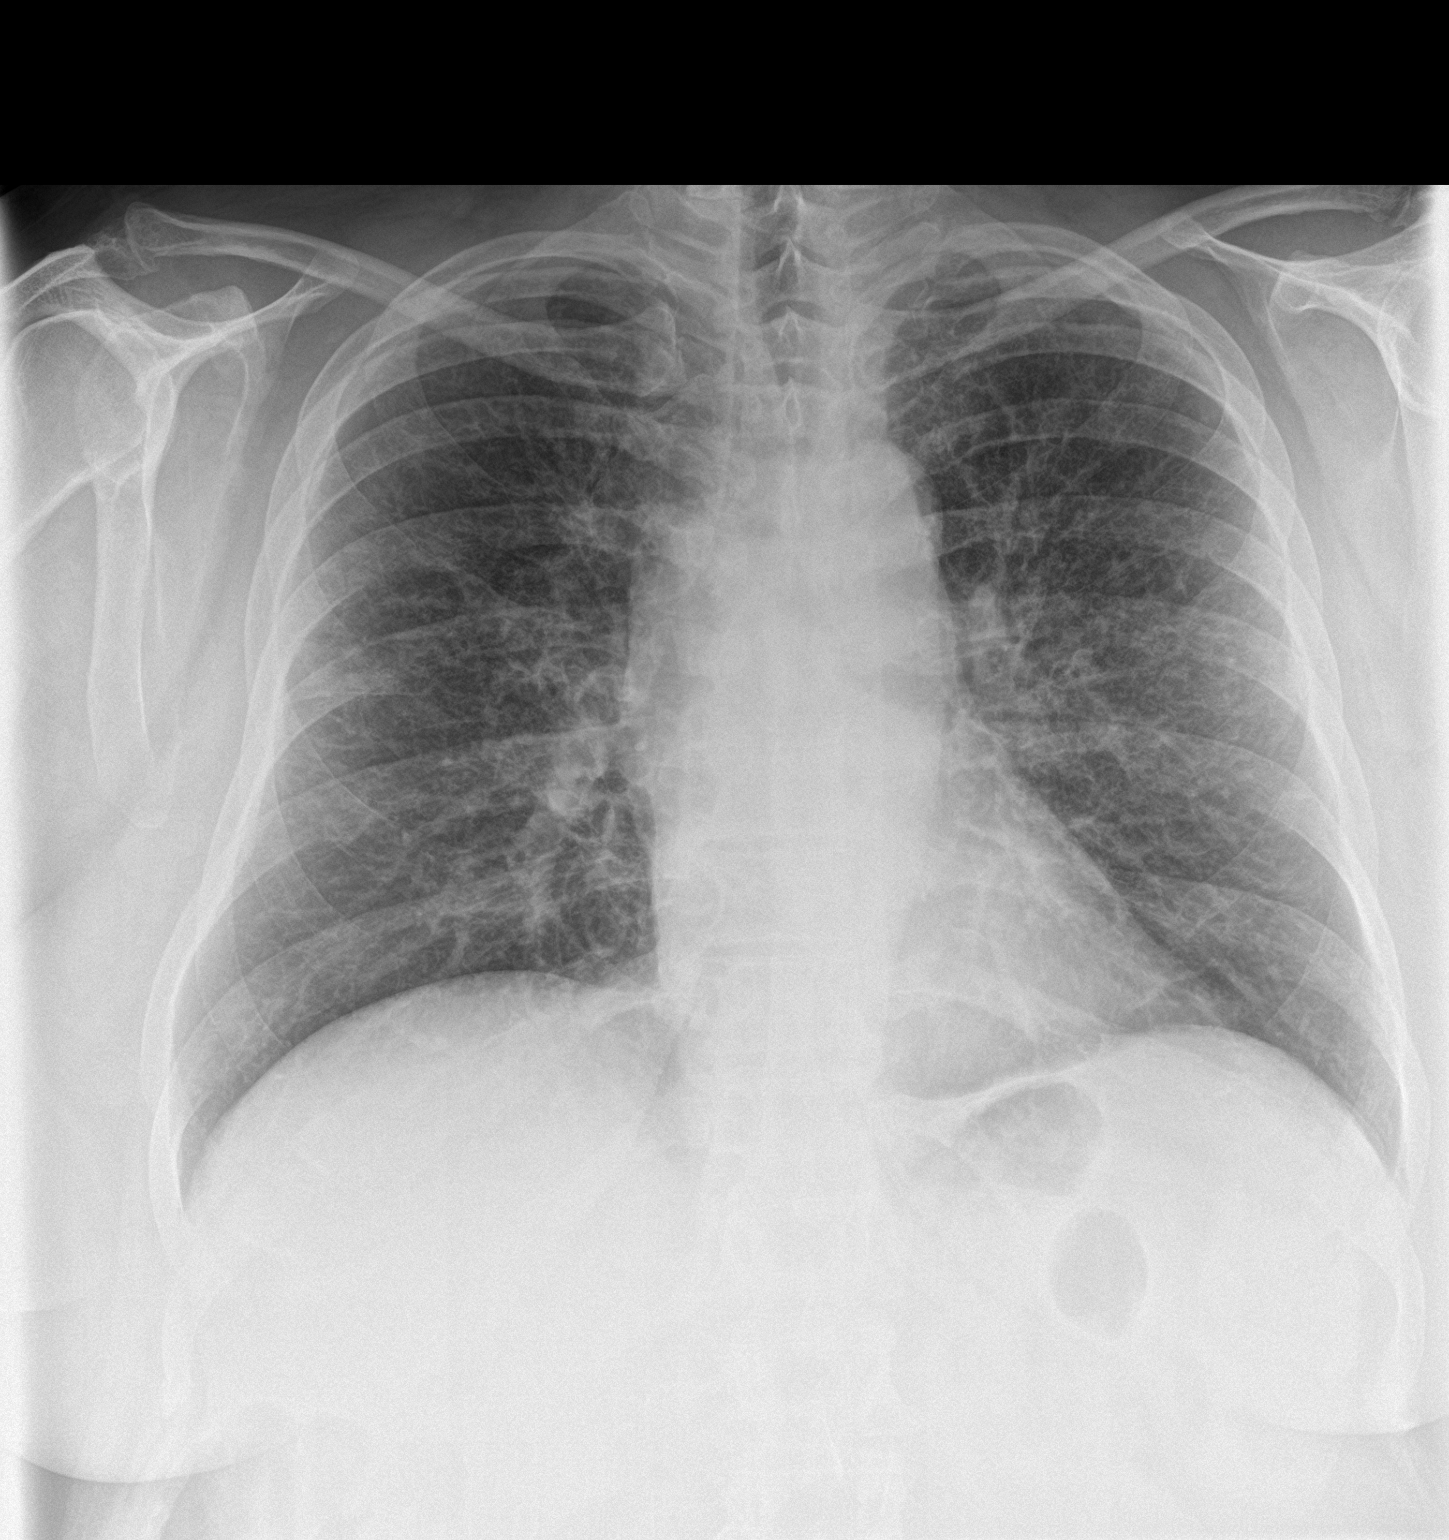
[im 2/2]
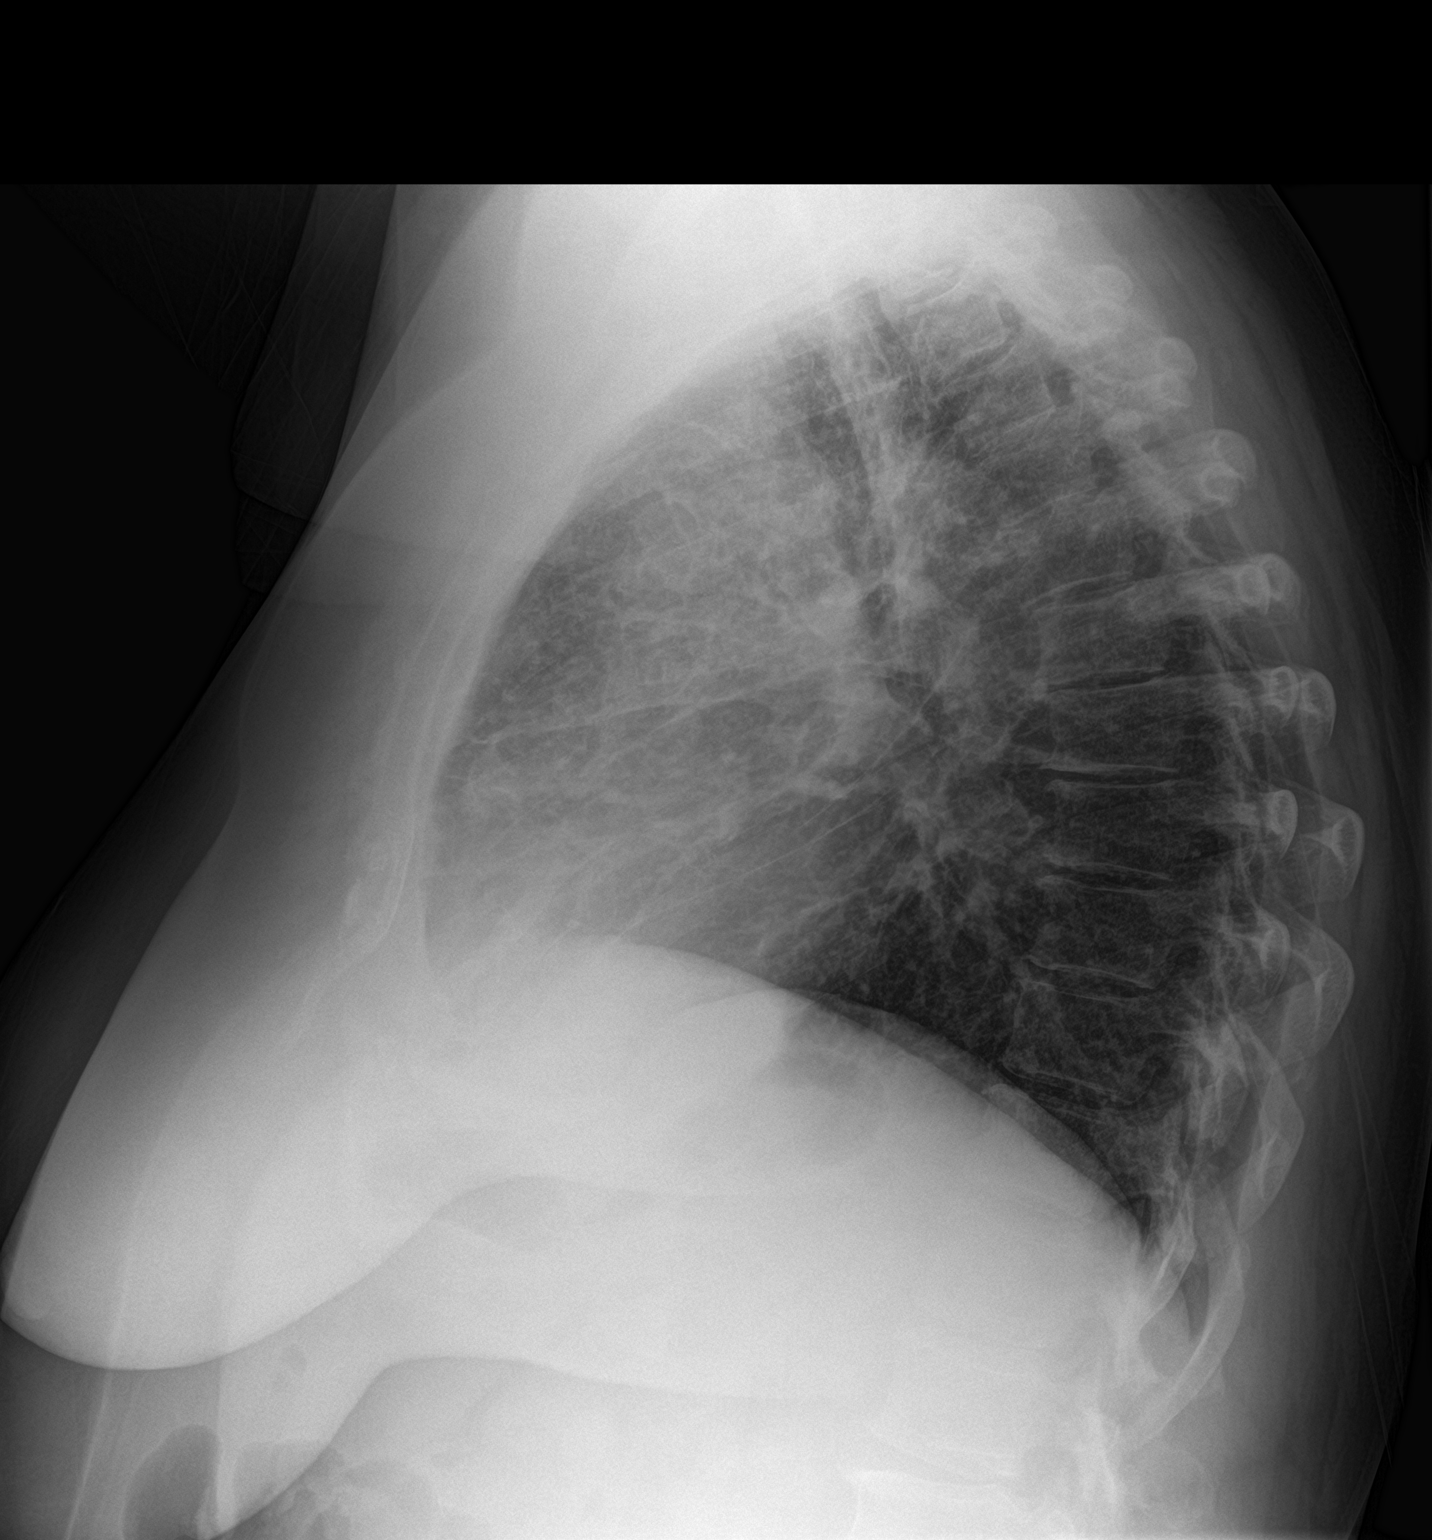

[2 of 2 positions shown; findings below may reference images not displayed]

FINDINGS: The heart size and mediastinal contours are within normal limits. No
pneumothorax or pleural effusion is noted. Mild diffuse interstitial
densities are noted throughout both lungs which may represent
scarring or atypical inflammation. The visualized skeletal
structures are unremarkable.
IMPRESSION: Mild diffuse interstitial densities are noted throughout both lungs
which may represent scarring or atypical inflammation.

## 2020-02-10 ENCOUNTER — Other Ambulatory Visit: Payer: Self-pay | Admitting: Family Medicine

## 2020-02-10 NOTE — Telephone Encounter (Signed)
Requested medication (s) are due for refill today: yes  Requested medication (s) are on the active medication list: yes  Last refill: 09/03/19  #60  3 refills  Future visit scheduled no  Notes to clinic:not delegated   Pts last appt 06/21/19  Requested Prescriptions  Pending Prescriptions Disp Refills   LORazepam (ATIVAN) 2 MG tablet [Pharmacy Med Name: LORAZEPAM 2 MG TABLET] 60 tablet 0    Sig: Take 1 tablet (2 mg total) by mouth 2 (two) times daily.      Not Delegated - Psychiatry:  Anxiolytics/Hypnotics Failed - 02/10/2020  3:46 PM      Failed - This refill cannot be delegated      Failed - Urine Drug Screen completed in last 360 days.      Failed - Valid encounter within last 6 months    Recent Outpatient Visits           7 months ago Acute left-sided low back pain, unspecified whether sciatica present   [facility] Malva Limes, MD   1 year ago Hyperlipidemia, mixed   [facility] Malva Limes, MD   2 years ago Cough   [facility] Malva Limes, MD   3 years ago Transient elevated blood pressure   [facility] Malva Limes, MD   4 years ago Numbness and tingling in right hand   [facility] Malva Limes, MD

## 2020-05-23 ENCOUNTER — Other Ambulatory Visit: Payer: Self-pay | Admitting: Family Medicine

## 2020-05-26 ENCOUNTER — Other Ambulatory Visit: Payer: Self-pay | Admitting: Family Medicine

## 2020-05-26 ENCOUNTER — Telehealth: Payer: Self-pay

## 2020-05-26 DIAGNOSIS — F419 Anxiety disorder, unspecified: Secondary | ICD-10-CM

## 2020-05-26 MED ORDER — LORAZEPAM 2 MG PO TABS: 2.0000 mg | ORAL_TABLET | Freq: Two times a day (BID) | ORAL | 3 refills | Status: DC

## 2020-05-26 NOTE — Telephone Encounter (Signed)
Patient advised that prescription has been sent to pharmacy.

## 2020-05-26 NOTE — Telephone Encounter (Signed)
Copied from CRM [id number]. Topic: General - Inquiry >> May 26, 2020 11:25 AM Daphine Deutscher D wrote: Reason for CRM: Pharmacy [address] told patient she does not have a refill on the Lorazepam but patient states she should/  CB#   [phone] or [phone]

## 2020-06-23 ENCOUNTER — Other Ambulatory Visit: Payer: Self-pay | Admitting: Family Medicine

## 2020-06-23 MED ORDER — ATORVASTATIN CALCIUM 20 MG PO TABS: 20.0000 mg | ORAL_TABLET | Freq: Every day | ORAL | 0 refills | Status: DC

## 2020-06-23 NOTE — Telephone Encounter (Signed)
Please review. Thanks!  

## 2020-06-23 NOTE — Telephone Encounter (Signed)
Courtesy refill given until appointment 08/08/20.

## 2020-06-23 NOTE — Telephone Encounter (Signed)
Requested medication (s) are due for refill today: Yes  Requested medication (s) are on the active medication list: Yes  Last refill:  11/19/19  Future visit scheduled: Yes  Notes to clinic:  Unable to refill per protocol, cannot delegate.      Requested Prescriptions  Pending Prescriptions Disp Refills   QUEtiapine (SEROQUEL) 25 MG tablet [Pharmacy Med Name: QUETIAPINE FUMARATE 25 MG TAB] 30 tablet 0    Sig: TAKE 1 TABLET AT BEDTIME AS NEEDED      Not Delegated - Psychiatry:  Antipsychotics - Second Generation (Atypical) - quetiapine Failed - 06/23/2020  1:46 PM      Failed - This refill cannot be delegated      Failed - ALT in normal range and within 180 days    ALT  Date Value Ref Range Status  02/25/2019 34 (H) 0 - 32 IU/L Final          Failed - AST in normal range and within 180 days    AST  Date Value Ref Range Status  02/25/2019 20 0 - 40 IU/L Final          Failed - Completed PHQ-2 or PHQ-9 in the last 360 days      Failed - Last BP in normal range    BP Readings from Last 1 Encounters:  06/21/19 (!) 142/80          Failed - Valid encounter within last 6 months    Recent Outpatient Visits           1 year ago Acute left-sided low back pain, unspecified whether sciatica present   [facility] Malva Limes, MD   1 year ago Hyperlipidemia, mixed   [facility] Malva Limes, MD   2 years ago Cough   [facility] Malva Limes, MD   3 years ago Transient elevated blood pressure   [facility] Malva Limes, MD   5 years ago Numbness and tingling in right hand   [facility] Malva Limes, MD       Future Appointments             In 1 month Fisher, Demetrios Isaacs, MD [facility]              Signed Prescriptions Disp Refills   atorvastatin (LIPITOR) 20 MG tablet 60 tablet 0    Sig: Take 1 tablet (20 mg total) by mouth daily. OFFICE VISIT NEEDED  FOR ADDITIONAL REFILLS      Cardiovascular:  Antilipid - Statins Failed - 06/23/2020  1:46 PM      Failed - Total Cholesterol in normal range and within 360 days    Cholesterol, Total  Date Value Ref Range Status  02/25/2019 237 (H) 100 - 199 mg/dL Final          Failed - LDL in normal range and within 360 days    LDL Chol Calc (NIH)  Date Value Ref Range Status  02/25/2019 141 (H) 0 - 99 mg/dL Final          Failed - HDL in normal range and within 360 days    HDL  Date Value Ref Range Status  02/25/2019 74 >39 mg/dL Final          Failed - Triglycerides in normal range and within 360 days    Triglycerides  Date Value Ref Range Status  02/25/2019 125 0 - 149 mg/dL Final  Failed - Valid encounter within last 12 months    Recent Outpatient Visits           1 year ago Acute left-sided low back pain, unspecified whether sciatica present   [facility] Malva Limes, MD   1 year ago Hyperlipidemia, mixed   [facility] Malva Limes, MD   2 years ago Cough   [facility] Malva Limes, MD   3 years ago Transient elevated blood pressure   [facility] Malva Limes, MD   5 years ago Numbness and tingling in right hand   [facility] Malva Limes, MD       Future Appointments             In 1 month Fisher, Demetrios Isaacs, MD [facility], [facility]             Passed - Patient is not pregnant

## 2020-06-23 NOTE — Telephone Encounter (Signed)
Patient called and advised it's due for a visit, physical and blood work, she verbalized understanding. Appointment scheduled for first available on 08/08/20 at 1500 with Dr. Sherrie Mustache. Advised refills until appointment, she asked for the atorvastatin refill.

## 2020-06-23 NOTE — Telephone Encounter (Signed)
Second request on QUEtiapine (SEROQUEL) 25 MG tablet refill.  Thanks, [employer]

## 2020-08-08 ENCOUNTER — Other Ambulatory Visit: Payer: Self-pay

## 2020-08-08 ENCOUNTER — Ambulatory Visit (INDEPENDENT_AMBULATORY_CARE_PROVIDER_SITE_OTHER): Payer: Self-pay | Admitting: Family Medicine

## 2020-08-08 VITALS — BP 128/75 | HR 75 | Ht 65.0 in | Wt 263.0 lb

## 2020-08-08 DIAGNOSIS — F419 Anxiety disorder, unspecified: Secondary | ICD-10-CM

## 2020-08-08 DIAGNOSIS — Z1239 Encounter for other screening for malignant neoplasm of breast: Secondary | ICD-10-CM

## 2020-08-08 DIAGNOSIS — Z01419 Encounter for gynecological examination (general) (routine) without abnormal findings: Secondary | ICD-10-CM

## 2020-08-08 DIAGNOSIS — E782 Mixed hyperlipidemia: Secondary | ICD-10-CM

## 2020-08-08 NOTE — Patient Instructions (Addendum)
.   Please review the attached list of medications and notify my office if there are any errors.   . Please bring all of your medications to every appointment so we can make sure that our medication list is the same as yours.   . Please call the [facility] ([phone]) to schedule a routine screening mammogram.  

## 2020-08-08 NOTE — Progress Notes (Signed)
Established patient   Patient: Judith Palmer   DOB: 16-Jan-1961   60 y.o. Female  MRN: [id number] Visit Date: 08/08/2020  Today's healthcare provider: Mila Merry, MD   Chief Complaint  Patient presents with  . Anxiety  . Hyperlipidemia   Subjective     HPI  Anxiety, Follow-up  She was last seen for anxiety 01/18/2019.  Changes made at last visit include none. Continue Lorazepam as needed.   She reports excellent compliance with treatment. She reports excellent tolerance of treatment. She is not having side effects.   She feels her anxiety is moderate and Worse since last visit.  Symptoms: No chest pain No difficulty concentrating  No dizziness No fatigue  No feelings of losing control Yes insomnia   No irritable No palpitations  No panic attacks No racing thoughts  No shortness of breath No sweating  No tremors/shakes    GAD-7 Results No flowsheet data found.  PHQ-9 Scores PHQ9 SCORE ONLY 08/08/2020  PHQ-9 Total Score 2    --------------------------------------------------------------------------------------------------- Lipid/Cholesterol, Follow-up  Last lipid panel Other pertinent labs  Lab Results  Component Value Date   CHOL 237 (H) 02/25/2019   HDL 74 02/25/2019   LDLCALC 141 (H) 02/25/2019   TRIG 125 02/25/2019   CHOLHDL 3.2 02/25/2019   Lab Results  Component Value Date   ALT 34 (H) 02/25/2019   AST 20 02/25/2019     She was last seen for this on 01/18/2019.   Management since that visit includes continuing same medications.  She reports excellent compliance with treatment. She is not having side effects.   Symptoms:  No chest pain No chest pressure/discomfort  No dyspnea No lower extremity edema  No numbness or tingling of extremity No orthopnea  No palpitations No paroxysmal nocturnal dyspnea  No speech difficulty No syncope   Current diet: well balanced Current exercise: walking and yard work  The 10-year ASCVD risk score  Denman George DC Jr., et al., 2013) is: 2.2%  ---------------------------------------------------------------------------------------------------  Follow up for Insomnia:  The patient was last seen for this more that 1 year ago.  Changes made at last visit include none; continue occasional Seroquel.  She reports excellent compliance with treatment. She feels that condition is Improved. She is not having side effects.   -----------------------------------------------------------------------------------------  Transient elevated blood pressure, follow-up  BP Readings from Last 3 Encounters:  08/08/20 128/75  06/21/19 (!) 142/80  01/18/19 137/84   Wt Readings from Last 3 Encounters:  08/08/20 263 lb (119.3 kg)  06/21/19 272 lb (123.4 kg)  11/24/17 248 lb (112.5 kg)     She was last seen for hypertension 01/18/2019. BP at that visit was 137/84. Management since that visit includes continuing low sodium diet.  She reports excellent compliance with treatment. She is not having side effects.  She is following a low carb and less greasy foods diet. She is exercising. She does not smoke.  Use of agents associated with hypertension: none.   Outside blood pressures are n/a. Symptoms: No chest pain No chest pressure  No palpitations No syncope  No dyspnea No orthopnea  No paroxysmal nocturnal dyspnea No lower extremity edema   Pertinent labs: Lab Results  Component Value Date   CHOL 237 (H) 02/25/2019   HDL 74 02/25/2019   LDLCALC 141 (H) 02/25/2019   TRIG 125 02/25/2019   CHOLHDL 3.2 02/25/2019   Lab Results  Component Value Date   NA 141 02/25/2019   K 4.0 02/25/2019  CREATININE 0.74 02/25/2019   GFRNONAA 91 02/25/2019   GFRAA 105 02/25/2019   GLUCOSE 101 (H) 02/25/2019     The 10-year ASCVD risk score Denman George DC Jr., et al., 2013) is: 2.2%   ---------------------------------------------------------------------------------------------------   No past medical history  on file. No past surgical history on file. Social History   Socioeconomic History  . Marital status: Single    Spouse name: Not on file  . Number of children: 0  . Years of education: Not on file  . Highest education level: Not on file  Occupational History  . Not on file  Tobacco Use  . Smoking status: Former Games developer  . Smokeless tobacco: Never Used  Substance and Sexual Activity  . Alcohol use: Yes    Alcohol/week: 0.0 standard drinks    Comment: 1-2 glasses of wine per month  . Drug use: No  . Sexual activity: Not on file  Other Topics Concern  . Not on file  Social History Narrative  . Not on file   Social Determinants of Health   Financial Resource Strain: Not on file  Food Insecurity: Not on file  Transportation Needs: Not on file  Physical Activity: Not on file  Stress: Not on file  Social Connections: Not on file  Intimate Partner Violence: Not on file   Family Status  Relation Name Status  . Mother  Alive  . Father  Deceased at age 57       MI  . Brother  Alive   Family History  Problem Relation Age of Onset  . Congestive Heart Failure Mother   . Hyperlipidemia Mother   . Heart attack Father   . Hyperlipidemia Brother    Allergies  Allergen Reactions  . Codeine     Patient Care Team: Malva Limes, MD as PCP - General (Family Medicine)   Medications: Outpatient Medications Prior to Visit  Medication Sig  . atorvastatin (LIPITOR) 20 MG tablet Take 1 tablet (20 mg total) by mouth daily. OFFICE VISIT NEEDED FOR ADDITIONAL REFILLS  . LORazepam (ATIVAN) 2 MG tablet Take 1 tablet (2 mg total) by mouth 2 (two) times daily.  . QUEtiapine (SEROQUEL) 25 MG tablet TAKE 1 TABLET AT BEDTIME AS NEEDED   No facility-administered medications prior to visit.    Review of Systems  Musculoskeletal: Positive for arthralgias.      Objective    BP 128/75 (BP Location: Right Arm, Patient Position: Sitting, Cuff Size: Large)   Pulse 75   Ht 5\' 5"  (1.651  m)   Wt 263 lb (119.3 kg)   SpO2 98%   BMI 43.77 kg/m    Physical Exam    General: Appearance:    Obese female in no acute distress  Eyes:    PERRL, conjunctiva/corneas clear, EOM's intact       Lungs:     Clear to auscultation bilaterally, respirations unlabored  Heart:    Normal heart rate. Normal rhythm. No murmurs, rubs, or gallops.   MS:   All extremities are intact.   Neurologic:   Awake, alert, oriented x 3. No apparent focal neurological           defect.         Last depression screening scores PHQ 2/9 Scores 08/08/2020  PHQ - 2 Score 0  PHQ- 9 Score 2   Last fall risk screening Fall Risk  08/08/2020  Falls in the past year? 0  Number falls in past yr: 0  Injury with  Fall? 0   Last Audit-C alcohol use screening Alcohol Use Disorder Test (AUDIT) 08/08/2020  1. How often do you have a drink containing alcohol? 2  2. How many drinks containing alcohol do you have on a typical day when you are drinking? 0  3. How often do you have six or more drinks on one occasion? 0  AUDIT-C Score 2  Alcohol Brief Interventions/Follow-up AUDIT Score <7 follow-up not indicated   A score of 3 or more in women, and 4 or more in men indicates increased risk for alcohol abuse, EXCEPT if all of the points are from question 1   No results found for any visits on 08/08/20.  Assessment & Plan     1. Hyperlipidemia, mixed She is tolerating atorvastatin well with no adverse effects.   - TSH - Comprehensive metabolic panel - Lipid panel  2. Anxiety Doing well with prn lorazepam and quetiapine hs  3. Encounter for screening for malignant neoplasm of breast, unspecified screening modality Given contact information to schedule mammogram.   4. Encounter for well woman exam with routine gynecological exam  - Ambulatory referral to Gynecology   Reviewed routine HM recommendations but she declined any additional screenings today due to lack of insurance.    She reports she has had Covid  vaccines x 3    The entirety of the information documented in the History of Present Illness, Review of Systems and Physical Exam were personally obtained by me. Portions of this information were initially documented by the CMA and reviewed by me for thoroughness and accuracy.      Mila Merry, MD  [facility] [phone] (phone) [phone] (fax)  [facility]

## 2020-08-09 LAB — LIPID PANEL
Chol/HDL Ratio: 3.5 ratio (ref 0.0–4.4)
Cholesterol, Total: 225 mg/dL — ABNORMAL HIGH (ref 100–199)
HDL: 65 mg/dL (ref >39)
LDL Chol Calc (NIH): 140 mg/dL — ABNORMAL HIGH (ref 0–99)
Triglycerides: 115 mg/dL (ref 0–149)
VLDL Cholesterol Cal: 20 mg/dL (ref 5–40)

## 2020-08-09 LAB — COMPREHENSIVE METABOLIC PANEL
ALT: 29 IU/L (ref 0–32)
AST: 22 IU/L (ref 0–40)
Albumin/Globulin Ratio: 2.1 (ref 1.2–2.2)
Albumin: 4.7 g/dL (ref 3.8–4.9)
Alkaline Phosphatase: 83 IU/L (ref 44–121)
BUN/Creatinine Ratio: 27 — ABNORMAL HIGH (ref 9–23)
BUN: 17 mg/dL (ref 6–24)
Bilirubin Total: 0.4 mg/dL (ref 0.0–1.2)
CO2: 25 mmol/L (ref 20–29)
Calcium: 9.8 mg/dL (ref 8.7–10.2)
Chloride: 100 mmol/L (ref 96–106)
Creatinine, Ser: 0.62 mg/dL (ref 0.57–1.00)
Globulin, Total: 2.2 g/dL (ref 1.5–4.5)
Glucose: 107 mg/dL — ABNORMAL HIGH (ref 65–99)
Potassium: 4.2 mmol/L (ref 3.5–5.2)
Sodium: 141 mmol/L (ref 134–144)
Total Protein: 6.9 g/dL (ref 6.0–8.5)
eGFR: 104 mL/min/{1.73_m2} (ref >59)

## 2020-08-09 LAB — TSH: TSH: 1.64 u[IU]/mL (ref 0.450–4.500)

## 2020-08-15 ENCOUNTER — Telehealth: Payer: Self-pay

## 2020-08-15 NOTE — Telephone Encounter (Signed)
[facility] referring for Encounter for well woman exam with routine gynecological exam. Called and left voicemail for patient to call back to be scheduled.

## 2020-08-16 NOTE — Telephone Encounter (Signed)
Called and left voicemail for patient to call back to be scheduled. 

## 2020-08-17 NOTE — Telephone Encounter (Signed)
Called and left voicemail for patient to call back to be scheduled. 

## 2020-09-11 ENCOUNTER — Other Ambulatory Visit: Payer: Self-pay | Admitting: Family Medicine

## 2020-09-11 NOTE — Telephone Encounter (Signed)
Requested Prescriptions  Pending Prescriptions Disp Refills  . QUEtiapine (SEROQUEL) 25 MG tablet [Pharmacy Med Name: QUETIAPINE FUMARATE 25 MG TAB] 30 tablet 0    Sig: TAKE 1 TABLET AT BEDTIME AS NEEDED     Not Delegated - Psychiatry:  Antipsychotics - Second Generation (Atypical) - quetiapine Failed - 09/11/2020 12:41 PM      Failed - This refill cannot be delegated      Passed - ALT in normal range and within 180 days    ALT  Date Value Ref Range Status  08/08/2020 29 0 - 32 IU/L Final         Passed - AST in normal range and within 180 days    AST  Date Value Ref Range Status  08/08/2020 22 0 - 40 IU/L Final         Passed - Completed PHQ-2 or PHQ-9 in the last 360 days      Passed - Last BP in normal range    BP Readings from Last 1 Encounters:  08/08/20 128/75         Passed - Valid encounter within last 6 months    Recent Outpatient Visits          1 month ago Hyperlipidemia, mixed   [facility] Malva Limes, MD   1 year ago Acute left-sided low back pain, unspecified whether sciatica present   [facility] Malva Limes, MD   1 year ago Hyperlipidemia, mixed   [facility] Malva Limes, MD   2 years ago Cough   [facility] Malva Limes, MD   4 years ago Transient elevated blood pressure   [facility] Malva Limes, MD             . atorvastatin (LIPITOR) 20 MG tablet [Pharmacy Med Name: ATORVASTATIN 20 MG TABLET] 60 tablet 0    Sig: Take 1 tablet (20 mg total) by mouth daily. OFFICE VISIT NEEDED FOR ADDITIONAL REFILLS     Cardiovascular:  Antilipid - Statins Failed - 09/11/2020 12:41 PM      Failed - Total Cholesterol in normal range and within 360 days    Cholesterol, Total  Date Value Ref Range Status  08/08/2020 225 (H) 100 - 199 mg/dL Final         Failed - LDL in normal range and within 360 days    LDL Chol Calc (NIH)  Date Value Ref Range Status   08/08/2020 140 (H) 0 - 99 mg/dL Final         Passed - HDL in normal range and within 360 days    HDL  Date Value Ref Range Status  08/08/2020 65 >39 mg/dL Final         Passed - Triglycerides in normal range and within 360 days    Triglycerides  Date Value Ref Range Status  08/08/2020 115 0 - 149 mg/dL Final         Passed - Patient is not pregnant      Passed - Valid encounter within last 12 months    Recent Outpatient Visits          1 month ago Hyperlipidemia, mixed   [facility] Malva Limes, MD   1 year ago Acute left-sided low back pain, unspecified whether sciatica present   [facility] Malva Limes, MD   1 year ago Hyperlipidemia, mixed   [facility] Malva Limes,  MD   2 years ago Cough   [facility] Malva Limes, MD   4 years ago Transient elevated blood pressure   [facility] Malva Limes, MD

## 2020-09-11 NOTE — Telephone Encounter (Signed)
Requested medication (s) are due for refill today: yes  Requested medication (s) are on the active medication list: yes  Last refill:  06/23/20  Future visit scheduled: no  Notes to clinic:  not delegated    Requested Prescriptions  Pending Prescriptions Disp Refills   QUEtiapine (SEROQUEL) 25 MG tablet [Pharmacy Med Name: QUETIAPINE FUMARATE 25 MG TAB] 30 tablet 0    Sig: TAKE 1 TABLET AT BEDTIME AS NEEDED      Not Delegated - Psychiatry:  Antipsychotics - Second Generation (Atypical) - quetiapine Failed - 09/11/2020 12:41 PM      Failed - This refill cannot be delegated      Passed - ALT in normal range and within 180 days    ALT  Date Value Ref Range Status  08/08/2020 29 0 - 32 IU/L Final          Passed - AST in normal range and within 180 days    AST  Date Value Ref Range Status  08/08/2020 22 0 - 40 IU/L Final          Passed - Completed PHQ-2 or PHQ-9 in the last 360 days      Passed - Last BP in normal range    BP Readings from Last 1 Encounters:  08/08/20 128/75          Passed - Valid encounter within last 6 months    Recent Outpatient Visits           1 month ago Hyperlipidemia, mixed   [facility] Malva Limes, MD   1 year ago Acute left-sided low back pain, unspecified whether sciatica present   [facility] Malva Limes, MD   1 year ago Hyperlipidemia, mixed   [facility] Malva Limes, MD   2 years ago Cough   [facility] Malva Limes, MD   4 years ago Transient elevated blood pressure   [facility] Malva Limes, MD                 Signed Prescriptions Disp Refills   atorvastatin (LIPITOR) 20 MG tablet 60 tablet 0    Sig: Take 1 tablet (20 mg total) by mouth daily. OFFICE VISIT NEEDED FOR ADDITIONAL REFILLS      Cardiovascular:  Antilipid - Statins Failed - 09/11/2020 12:41 PM      Failed - Total Cholesterol in normal range and within 360  days    Cholesterol, Total  Date Value Ref Range Status  08/08/2020 225 (H) 100 - 199 mg/dL Final          Failed - LDL in normal range and within 360 days    LDL Chol Calc (NIH)  Date Value Ref Range Status  08/08/2020 140 (H) 0 - 99 mg/dL Final          Passed - HDL in normal range and within 360 days    HDL  Date Value Ref Range Status  08/08/2020 65 >39 mg/dL Final          Passed - Triglycerides in normal range and within 360 days    Triglycerides  Date Value Ref Range Status  08/08/2020 115 0 - 149 mg/dL Final          Passed - Patient is not pregnant      Passed - Valid encounter within last 12 months    Recent Outpatient Visits  1 month ago Hyperlipidemia, mixed   [facility] Malva Limes, MD   1 year ago Acute left-sided low back pain, unspecified whether sciatica present   [facility] Malva Limes, MD   1 year ago Hyperlipidemia, mixed   [facility] Malva Limes, MD   2 years ago Cough   [facility] Malva Limes, MD   4 years ago Transient elevated blood pressure   [facility] Malva Limes, MD

## 2020-11-16 ENCOUNTER — Other Ambulatory Visit: Payer: Self-pay | Admitting: Family Medicine

## 2020-11-16 NOTE — Telephone Encounter (Signed)
  Notes to clinic:  script requested has expired  Review for continued use and refill    Requested Prescriptions  Pending Prescriptions Disp Refills   atorvastatin (LIPITOR) 20 MG tablet [Pharmacy Med Name: ATORVASTATIN 20 MG TABLET] 30 tablet 0    Sig: Take 1 tablet (20 mg total) by mouth daily.      Cardiovascular:  Antilipid - Statins Failed - 11/16/2020  1:48 PM      Failed - Total Cholesterol in normal range and within 360 days    Cholesterol, Total  Date Value Ref Range Status  08/08/2020 225 (H) 100 - 199 mg/dL Final          Failed - LDL in normal range and within 360 days    LDL Chol Calc (NIH)  Date Value Ref Range Status  08/08/2020 140 (H) 0 - 99 mg/dL Final          Passed - HDL in normal range and within 360 days    HDL  Date Value Ref Range Status  08/08/2020 65 >39 mg/dL Final          Passed - Triglycerides in normal range and within 360 days    Triglycerides  Date Value Ref Range Status  08/08/2020 115 0 - 149 mg/dL Final          Passed - Patient is not pregnant      Passed - Valid encounter within last 12 months    Recent Outpatient Visits           3 months ago Hyperlipidemia, mixed   [facility] Malva Limes, MD   1 year ago Acute left-sided low back pain, unspecified whether sciatica present   [facility] Malva Limes, MD   1 year ago Hyperlipidemia, mixed   [facility] Malva Limes, MD   2 years ago Cough   [facility] Malva Limes, MD   4 years ago Transient elevated blood pressure   [facility] Malva Limes, MD

## 2020-12-15 ENCOUNTER — Ambulatory Visit: Payer: Self-pay | Admitting: *Deleted

## 2020-12-15 NOTE — Telephone Encounter (Signed)
Reason for Disposition  [1] COVID-19 diagnosed by positive lab test (e.g., PCR, rapid self-test kit) AND [2] mild symptoms (e.g., cough, fever, others) AND [9] no complications or SOB  Answer Assessment - Initial Assessment Questions 1. COVID-19 DIAGNOSIS: "Who made your COVID-19 diagnosis?" "Was it confirmed by a positive lab test or self-test?" If not diagnosed by a doctor (or NP/PA), ask "Are there lots of cases (community spread) where you live?" Note: See public health department website, if unsure.     Covid home test done positive. 2. COVID-19 EXPOSURE: "Was there any known exposure to COVID before the symptoms began?" CDC Definition of close contact: within 6 feet (2 meters) for a total of 15 minutes or more over a 24-hour period.      *No Answer* 3. ONSET: "When did the COVID-19 symptoms start?"      I'm having chills, body aches, headache, fever, sweating, sore throat, runny nose.    Mild cough 4. WORST SYMPTOM: "What is your worst symptom?" (e.g., cough, fever, shortness of breath, muscle aches)     Body aches 5. COUGH: "Do you have a cough?" If Yes, ask: "How bad is the cough?"       Dry hacky cough very mild 6. FEVER: "Do you have a fever?" If Yes, ask: "What is your temperature, how was it measured, and when did it start?"     Fever chills and sweating  7. RESPIRATORY STATUS: "Describe your breathing?" (e.g., shortness of breath, wheezing, unable to speak)      No chest tightness.   No shortness of breath but it's a little bit effort to take in a breath.  No lung issues. 8. BETTER-SAME-WORSE: "Are you getting better, staying the same or getting worse compared to yesterday?"  If getting worse, ask, "In what way?"     same 9. HIGH RISK DISEASE: "Do you have any chronic medical problems?" (e.g., asthma, heart or lung disease, weak immune system, obesity, etc.)     A little overweight  10. VACCINE: "Have you had the COVID-19 vaccine?" If Yes, ask: "Which one, how many shots, when did  you get it?"       Not asked  11. BOOSTER: "Have you received your COVID-19 booster?" If Yes, ask: "Which one and when did you get it?"       Not asked  12. PREGNANCY: "Is there any chance you are pregnant?" "When was your last menstrual period?"       N/A  13. OTHER SYMPTOMS: "Do you have any other symptoms?"  (e.g., chills, fatigue, headache, loss of smell or taste, muscle pain, sore throat)       Headache, sore throat  14. O2 SATURATION MONITOR:  "Do you use an oxygen saturation monitor (pulse oximeter) at home?" If Yes, ask "What is your reading (oxygen level) today?" "What is your usual oxygen saturation reading?" (e.g., 95%)       No  Protocols used: Coronavirus (COVID-19) Diagnosed or Suspected-A-AH

## 2020-12-15 NOTE — Telephone Encounter (Signed)
If she has had two boosters than I would let it ride. Otherwise I recommend paxlovid.

## 2020-12-15 NOTE — Telephone Encounter (Signed)
FYI. Thanks.

## 2020-12-15 NOTE — Telephone Encounter (Signed)
Pt called in with a positive home Covid test today.   Started feeling bad yesterday.   No high risk health problems.   Went over the care advice and offered her the MyChart visit options over the weekend if she becomes worse.  I let her know I would let Dr. Sherrie Mustache know in case he had any other suggestions.   She was agreeable with the care advice.   I went over the quarantine protocol, etc.  I sent my notes to [facility].

## 2020-12-25 ENCOUNTER — Other Ambulatory Visit: Payer: Self-pay | Admitting: Family Medicine

## 2020-12-25 DIAGNOSIS — F419 Anxiety disorder, unspecified: Secondary | ICD-10-CM

## 2021-02-21 ENCOUNTER — Other Ambulatory Visit: Payer: Self-pay | Admitting: Family Medicine

## 2021-02-21 NOTE — Telephone Encounter (Signed)
Requested medication (s) are due for refill today - yes  Requested medication (s) are on the active medication list -yes  Future visit scheduled - no  Last refill: 09/12/20 #30 2 RF  Notes to clinic: Request RF: non delegated Rx  Requested Prescriptions  Pending Prescriptions Disp Refills   QUEtiapine (SEROQUEL) 25 MG tablet [Pharmacy Med Name: QUETIAPINE FUMARATE 25 MG TAB] 30 tablet 0    Sig: TAKE 1 TABLET AT BEDTIME AS NEEDED     Not Delegated - Psychiatry:  Antipsychotics - Second Generation (Atypical) - quetiapine Failed - 02/21/2021  3:34 PM      Failed - This refill cannot be delegated      Failed - ALT in normal range and within 180 days    ALT  Date Value Ref Range Status  08/08/2020 29 0 - 32 IU/L Final          Failed - AST in normal range and within 180 days    AST  Date Value Ref Range Status  08/08/2020 22 0 - 40 IU/L Final          Failed - Valid encounter within last 6 months    Recent Outpatient Visits           6 months ago Hyperlipidemia, mixed   [facility] Malva Limes, MD   1 year ago Acute left-sided low back pain, unspecified whether sciatica present   [facility] Malva Limes, MD   2 years ago Hyperlipidemia, mixed   [facility] Malva Limes, MD   3 years ago Cough   [facility] Malva Limes, MD   4 years ago Transient elevated blood pressure   [facility] Malva Limes, MD              Passed - Completed PHQ-2 or PHQ-9 in the last 360 days      Passed - Last BP in normal range    BP Readings from Last 1 Encounters:  08/08/20 128/75             Requested Prescriptions  Pending Prescriptions Disp Refills   QUEtiapine (SEROQUEL) 25 MG tablet [Pharmacy Med Name: QUETIAPINE FUMARATE 25 MG TAB] 30 tablet 0    Sig: TAKE 1 TABLET AT BEDTIME AS NEEDED     Not Delegated - Psychiatry:  Antipsychotics - Second Generation (Atypical) -  quetiapine Failed - 02/21/2021  3:34 PM      Failed - This refill cannot be delegated      Failed - ALT in normal range and within 180 days    ALT  Date Value Ref Range Status  08/08/2020 29 0 - 32 IU/L Final          Failed - AST in normal range and within 180 days    AST  Date Value Ref Range Status  08/08/2020 22 0 - 40 IU/L Final          Failed - Valid encounter within last 6 months    Recent Outpatient Visits           6 months ago Hyperlipidemia, mixed   [facility] Malva Limes, MD   1 year ago Acute left-sided low back pain, unspecified whether sciatica present   [facility] Malva Limes, MD   2 years ago Hyperlipidemia, mixed   [facility] Mal[facility] Limes, [facility]   3 years ago Cough   [facility] Malva Limes, MD   4 years ago Transient elevated blood pressure   [facility] Malva Limes, MD              Passed - Completed PHQ-2 or PHQ-9 in the last 360 days      Passed - Last BP in normal range    BP Readings from Last 1 Encounters:  08/08/20 128/75

## 2021-08-09 ENCOUNTER — Other Ambulatory Visit: Payer: Self-pay | Admitting: Family Medicine

## 2021-08-09 DIAGNOSIS — F419 Anxiety disorder, unspecified: Secondary | ICD-10-CM

## 2021-10-09 ENCOUNTER — Other Ambulatory Visit: Payer: Self-pay | Admitting: Family Medicine

## 2021-10-09 DIAGNOSIS — F419 Anxiety disorder, unspecified: Secondary | ICD-10-CM

## 2021-12-07 ENCOUNTER — Other Ambulatory Visit: Payer: Self-pay | Admitting: Family Medicine

## 2021-12-07 DIAGNOSIS — F419 Anxiety disorder, unspecified: Secondary | ICD-10-CM

## 2021-12-07 NOTE — Telephone Encounter (Signed)
Called patient to schedule appt for medication refills. No answer, rang multiple times. Unable to leave message.

## 2021-12-07 NOTE — Telephone Encounter (Signed)
Requested medication (s) are due for refill today: yes  Requested medication (s) are on the active medication list: yes  Last refill:  ativan- 10/10/21 #30 0 refills, seroquel- 10/10/21 #25 0 refills  Future visit scheduled: no  Notes to clinic:  not delegated per protocol. Attempted to contact patient regarding appt for medication refills. No answer unable to leave message. Do you want to refill Rx?     Requested Prescriptions  Pending Prescriptions Disp Refills   LORazepam (ATIVAN) 2 MG tablet [Pharmacy Med Name: LORAZEPAM 2 MG TABLET] 60 tablet 0    Sig: TAKE 1 TABLET TWICE A DAY     Not Delegated - Psychiatry: Anxiolytics/Hypnotics 2 Failed - 12/07/2021  1:04 PM      Failed - This refill cannot be delegated      Failed - Urine Drug Screen completed in last 360 days      Failed - Valid encounter within last 6 months    Recent Outpatient Visits           1 year ago Hyperlipidemia, mixed   [facility] Birdie Sons, MD   2 years ago Acute left-sided low back pain, unspecified whether sciatica present   [facility] Birdie Sons, MD   2 years ago Hyperlipidemia, mixed   [facility] Birdie Sons, MD   4 years ago Cough   [facility] Birdie Sons, MD   5 years ago Transient elevated blood pressure   [facility] Birdie Sons, MD              Passed - Patient is not pregnant       QUEtiapine (SEROQUEL) 25 MG tablet [Pharmacy Med Name: QUETIAPINE FUMARATE 25 MG TAB] 25 tablet 0    Sig: TAKE 1 TABLET AT BEDTIME AS NEEDED     Not Delegated - Psychiatry:  Antipsychotics - Second Generation (Atypical) - quetiapine Failed - 12/07/2021  1:04 PM      Failed - This refill cannot be delegated      Failed - TSH in normal range and within 360 days    TSH  Date Value Ref Range Status  08/08/2020 1.640 0.450 - 4.500 uIU/mL Final         Failed - Completed PHQ-2 or PHQ-9 in the last 360  days      Failed - Valid encounter within last 6 months    Recent Outpatient Visits           1 year ago Hyperlipidemia, mixed   [facility] Birdie Sons, MD   2 years ago Acute left-sided low back pain, unspecified whether sciatica present   [facility] Birdie Sons, MD   2 years ago Hyperlipidemia, mixed   [facility] Birdie Sons, MD   4 years ago Cough   [facility] Birdie Sons, MD   5 years ago Transient elevated blood pressure   [facility] Birdie Sons, MD              Failed - Lipid Panel in normal range within the last 12 months    Cholesterol, Total  Date Value Ref Range Status  08/08/2020 225 (H) 100 - 199 mg/dL Final   LDL Chol Calc (NIH)  Date Value Ref Range Status  08/08/2020 140 (H) 0 - 99 mg/dL Final   HDL  Date Value Ref Range Status  08/08/2020 65 >39 mg/dL Final  Triglycerides  Date Value Ref Range Status  08/08/2020 115 0 - 149 mg/dL Final         Failed - CBC within normal limits and completed in the last 12 months    No results found for: "WBC", "WBCKUC" No results found for: "RBC", "RBCKUC" No results found for: "HGB", "HGBKUC", "HGBPOCKUC", "HGBOTHER", "TOTHGB", "HGBPLASMA", "LABHEMOF" No results found for: "HCT", "HCTKUC", "SRHCT" No results found for: "MCHC", "[place]" No results found for: "MCH", "[place]" No results found for: "MCVKUC", "MCV" No results found for: "PLTCOUNTKUC", "LABPLAT", "POCPLA" No results found for: "RDW", "RDWKUC", "POCRDW"       Failed - CMP within normal limits and completed in the last 12 months    Albumin  Date Value Ref Range Status  08/08/2020 4.7 3.8 - 4.9 g/dL Final   Alkaline Phosphatase  Date Value Ref Range Status  08/08/2020 83 44 - 121 IU/L Final   ALT  Date Value Ref Range Status  08/08/2020 29 0 - 32 IU/L Final   AST  Date Value Ref Range Status  08/08/2020 22 0 - 40 IU/L Final   BUN   Date Value Ref Range Status  08/08/2020 17 6 - 24 mg/dL Final   Calcium  Date Value Ref Range Status  08/08/2020 9.8 8.7 - 10.2 mg/dL Final   CO2  Date Value Ref Range Status  08/08/2020 25 20 - 29 mmol/L Final   Creatinine, Ser  Date Value Ref Range Status  08/08/2020 0.62 0.57 - 1.00 mg/dL Final   Glucose  Date Value Ref Range Status  08/08/2020 107 (H) 65 - 99 mg/dL Final  08/18/2014 111 mg/dL Final   Potassium  Date Value Ref Range Status  08/08/2020 4.2 3.5 - 5.2 mmol/L Final   Sodium  Date Value Ref Range Status  08/08/2020 141 134 - 144 mmol/L Final   Bilirubin Total  Date Value Ref Range Status  08/08/2020 0.4 0.0 - 1.2 mg/dL Final   Protein, UA  Date Value Ref Range Status  06/21/2019 Negative Negative Final   Total Protein  Date Value Ref Range Status  08/08/2020 6.9 6.0 - 8.5 g/dL Final   GFR calc Af Amer  Date Value Ref Range Status  02/25/2019 105 >59 mL/min/1.73 Final   eGFR  Date Value Ref Range Status  08/08/2020 104 >59 mL/min/1.73 Final   GFR calc non Af Amer  Date Value Ref Range Status  02/25/2019 91 >59 mL/min/1.73 Final         Passed - Last BP in normal range    BP Readings from Last 1 Encounters:  08/08/20 128/75         Passed - Last Heart Rate in normal range    Pulse Readings from Last 1 Encounters:  08/08/20 75

## 2021-12-12 ENCOUNTER — Other Ambulatory Visit: Payer: Self-pay | Admitting: Family Medicine

## 2021-12-12 DIAGNOSIS — F419 Anxiety disorder, unspecified: Secondary | ICD-10-CM

## 2021-12-13 MED ORDER — QUETIAPINE FUMARATE 25 MG PO TABS: 25.0000 mg | ORAL_TABLET | Freq: Every evening | ORAL | 0 refills | Status: DC | PRN

## 2021-12-13 MED ORDER — LORAZEPAM 2 MG PO TABS: 2.0000 mg | ORAL_TABLET | Freq: Two times a day (BID) | ORAL | 0 refills | Status: DC

## 2022-01-03 NOTE — Progress Notes (Unsigned)
Established patient visit  I,April Miller,acting as a scribe for Mila Merry, MD.,have documented all relevant documentation on the behalf of Mila Merry, MD,as directed by  Mila Merry, MD while in the presence of Mila Merry, MD.   Patient: Judith Palmer   DOB: 05-16-1961   61 y.o. Female  MRN: [id number] Visit Date: 01/04/2022  Today's healthcare provider: Mila Merry, MD   Chief Complaint  Patient presents with   Follow-up   Anxiety   Hyperlipidemia   Subjective    HPI  Anxiety, Follow-up  She was last seen for anxiety 08/08/2020.   Changes made at last visit include none; continue same medications.   She reports good compliance with treatment. She reports good tolerance of treatment. She is not having side effects. none  She feels her anxiety is moderate and Unchanged since last visit.  Symptoms: No chest pain No difficulty concentrating  No dizziness Yes fatigue  No feelings of losing control No insomnia  No irritable No palpitations  No panic attacks No racing thoughts  No shortness of breath No sweating  No tremors/shakes    GAD-7 Results     No data to display          PHQ-9 Scores    01/04/2022    1:24 PM 08/08/2020    3:17 PM  PHQ9 SCORE ONLY  PHQ-9 Total Score 3 2    ---------------------------------------------------------------------------------------------------   Lipid/Cholesterol, Follow-up  Last lipid panel Other pertinent labs  Lab Results  Component Value Date   CHOL 225 (H) 08/08/2020   HDL 65 08/08/2020   LDLCALC 140 (H) 08/08/2020   TRIG 115 08/08/2020   CHOLHDL 3.5 08/08/2020   Lab Results  Component Value Date   ALT 29 08/08/2020   AST 22 08/08/2020   TSH 1.640 08/08/2020     She was last seen for this 08/08/2020.  Management since that visit includes continuing same medication. Advised to avoid sweets and starchy foods.   She reports good compliance with treatment. She is not having side effects.  none  Current diet: well balanced Current exercise: walking  The 10-year ASCVD risk score (Arnett DK, et al., 2019) is: 4%  ---------------------------------------------------------------------------------------------------  She does reports pain in needs from OA, but OTC ibuprofen and diclofenac gel is helpful.    Medications: Outpatient Medications Prior to Visit  Medication Sig   atorvastatin (LIPITOR) 20 MG tablet Take 1 tablet (20 mg total) by mouth daily.   LORazepam (ATIVAN) 2 MG tablet Take 1 tablet (2 mg total) by mouth 2 (two) times daily.   QUEtiapine (SEROQUEL) 25 MG tablet Take 1 tablet (25 mg total) by mouth at bedtime as needed.   No facility-administered medications prior to visit.    Review of Systems  {Labs  Heme  Chem  Endocrine  Serology  Results Review (optional):23779}   Objective    BP (!) 152/82 (BP Location: Right Arm, Patient Position: Sitting, Cuff Size: Large)   Pulse 86   Resp 16   Wt 254 lb (115.2 kg)   SpO2 98%   BMI 42.27 kg/m  {Show previous vital signs (optional):23777}  Physical Exam   General: Appearance:    Obese female in no acute distress  Eyes:    PERRL, conjunctiva/corneas clear, EOM's intact       Lungs:     Clear to auscultation bilaterally, respirations unlabored  Heart:    Normal heart rate. Normal rhythm. No murmurs, rubs, or gallops.  MS:   All extremities are intact.    Neurologic:   Awake, alert, oriented x 3. No apparent focal neurological defect.         Assessment & Plan     1. Anxiety Doing well with occasional lorazepam.   2. Hyperlipidemia, mixed She is tolerating simvastatin well with no adverse effects.   - Lipid panel - Comprehensive metabolic panel  3. Primary insomnia Doing well with current dose of Seroquel  4. Primary osteoarthritis of both knees Doing well with OTC remedies.   Reminded to schedule screening mammogram and given OC-Lyte      {provider attestation***:1}   Mila Merry, MD  [facility] [phone] (phone) [phone] (fax)  [facility]

## 2022-01-04 ENCOUNTER — Encounter: Payer: Self-pay | Admitting: Family Medicine

## 2022-01-04 ENCOUNTER — Ambulatory Visit (INDEPENDENT_AMBULATORY_CARE_PROVIDER_SITE_OTHER): Payer: Self-pay | Admitting: Family Medicine

## 2022-01-04 VITALS — BP 152/82 | HR 86 | Resp 16 | Wt 254.0 lb

## 2022-01-04 DIAGNOSIS — F5101 Primary insomnia: Secondary | ICD-10-CM

## 2022-01-04 DIAGNOSIS — E782 Mixed hyperlipidemia: Secondary | ICD-10-CM

## 2022-01-04 DIAGNOSIS — M170 Bilateral primary osteoarthritis of knee: Secondary | ICD-10-CM

## 2022-01-04 DIAGNOSIS — F419 Anxiety disorder, unspecified: Secondary | ICD-10-CM

## 2022-01-04 NOTE — Patient Instructions (Addendum)
Please review the attached list of medications and notify my office if there are any errors.   Please call the [facility] [phone]) to schedule a routine screening mammogram.   The current recommendation is to start colon cancer screening with a colonoscopy or Cologuard test at age 59. I suggest checking with insurance to see if this screening test is covered.  Advance colon cancer is completely preventable, but only if the screening is done

## 2022-01-05 LAB — COMPREHENSIVE METABOLIC PANEL
ALT: 44 IU/L — ABNORMAL HIGH (ref 0–32)
AST: 28 IU/L (ref 0–40)
Albumin/Globulin Ratio: 2.2 (ref 1.2–2.2)
Albumin: 4.6 g/dL (ref 3.8–4.9)
Alkaline Phosphatase: 79 IU/L (ref 44–121)
BUN/Creatinine Ratio: 22 (ref 9–23)
BUN: 15 mg/dL (ref 6–24)
Bilirubin Total: 0.7 mg/dL (ref 0.0–1.2)
CO2: 23 mmol/L (ref 20–29)
Calcium: 9.1 mg/dL (ref 8.7–10.2)
Chloride: 104 mmol/L (ref 96–106)
Creatinine, Ser: 0.67 mg/dL (ref 0.57–1.00)
Globulin, Total: 2.1 g/dL (ref 1.5–4.5)
Glucose: 116 mg/dL — ABNORMAL HIGH (ref 70–99)
Potassium: 4.1 mmol/L (ref 3.5–5.2)
Sodium: 143 mmol/L (ref 134–144)
Total Protein: 6.7 g/dL (ref 6.0–8.5)
eGFR: 101 mL/min/{1.73_m2} (ref >59)

## 2022-01-05 LAB — LIPID PANEL
Chol/HDL Ratio: 3 ratio (ref 0.0–4.4)
Cholesterol, Total: 220 mg/dL — ABNORMAL HIGH (ref 100–199)
HDL: 73 mg/dL (ref >39)
LDL Chol Calc (NIH): 127 mg/dL — ABNORMAL HIGH (ref 0–99)
Triglycerides: 116 mg/dL (ref 0–149)
VLDL Cholesterol Cal: 20 mg/dL (ref 5–40)

## 2022-01-22 ENCOUNTER — Other Ambulatory Visit: Payer: Self-pay | Admitting: Family Medicine

## 2022-01-22 DIAGNOSIS — F419 Anxiety disorder, unspecified: Secondary | ICD-10-CM

## 2022-02-26 ENCOUNTER — Other Ambulatory Visit (INDEPENDENT_AMBULATORY_CARE_PROVIDER_SITE_OTHER): Payer: Self-pay | Admitting: *Deleted

## 2022-02-26 DIAGNOSIS — Z1211 Encounter for screening for malignant neoplasm of colon: Secondary | ICD-10-CM

## 2022-02-26 LAB — IFOBT (OCCULT BLOOD): IFOBT: NEGATIVE

## 2022-04-12 ENCOUNTER — Other Ambulatory Visit: Payer: Self-pay | Admitting: Family Medicine

## 2022-04-12 DIAGNOSIS — F419 Anxiety disorder, unspecified: Secondary | ICD-10-CM

## 2022-04-15 ENCOUNTER — Telehealth: Payer: Self-pay | Admitting: Family Medicine

## 2022-04-15 NOTE — Telephone Encounter (Signed)
BPt is calling to report that she picked up her medication today. Pt would like script to be changed to  60pills for the next time. CB- [phone]

## 2022-04-25 ENCOUNTER — Other Ambulatory Visit: Payer: Self-pay | Admitting: Family Medicine

## 2022-04-25 DIAGNOSIS — F419 Anxiety disorder, unspecified: Secondary | ICD-10-CM

## 2022-04-25 MED ORDER — LORAZEPAM 2 MG PO TABS: 2.0000 mg | ORAL_TABLET | Freq: Two times a day (BID) | ORAL | 4 refills | Status: DC

## 2022-05-02 ENCOUNTER — Other Ambulatory Visit: Payer: Self-pay | Admitting: Family Medicine

## 2022-05-02 NOTE — Telephone Encounter (Signed)
Incorrect dose- not taking 40mg  Requested Prescriptions  Pending Prescriptions Disp Refills   atorvastatin (LIPITOR) 40 MG tablet [Pharmacy Med Name: ATORVASTATIN 40 MG TABLET] 90 tablet 0    Sig: Take 1 tablet (20 mg total) by mouth daily.     Cardiovascular:  Antilipid - Statins Failed - 05/02/2022  1:53 PM      Failed - Lipid Panel in normal range within the last 12 months    Cholesterol, Total  Date Value Ref Range Status  01/04/2022 220 (H) 100 - 199 mg/dL Final   LDL Chol Calc (NIH)  Date Value Ref Range Status  01/04/2022 127 (H) 0 - 99 mg/dL Final   HDL  Date Value Ref Range Status  01/04/2022 73 >39 mg/dL Final   Triglycerides  Date Value Ref Range Status  01/04/2022 116 0 - 149 mg/dL Final         Passed - Patient is not pregnant      Passed - Valid encounter within last 12 months    Recent Outpatient Visits           3 months ago Anxiety   [facility], [place]   1 year ago Hyperlipidemia, mixed   [facility], [place]   2 years ago Acute left-sided low back pain, unspecified whether sciatica present   [facility], [place]   3 years ago Hyperlipidemia, mixed   [facility], [place]   4 years ago Cough   [facility], [place]

## 2022-05-22 ENCOUNTER — Other Ambulatory Visit: Payer: Self-pay | Admitting: Family Medicine

## 2022-05-24 ENCOUNTER — Other Ambulatory Visit: Payer: Self-pay | Admitting: Family Medicine

## 2022-05-24 MED ORDER — ATORVASTATIN CALCIUM 20 MG PO TABS: 20.0000 mg | ORAL_TABLET | Freq: Every day | ORAL | 1 refills | Status: DC

## 2022-05-24 NOTE — Telephone Encounter (Signed)
Requested medications are due for refill today.  unsure  Requested medications are on the active medications list.  yes  Last refill. 11/16/2020 #90 4 rf  Future visit scheduled.   no  Notes to clinic.  In office note provider states that pt is tolerating simvastatin well. Med list show pt on atorvastatin. Please advise.    Requested Prescriptions  Pending Prescriptions Disp Refills   atorvastatin (LIPITOR) 20 MG tablet 90 tablet 4    Sig: Take 1 tablet (20 mg total) by mouth daily.     Cardiovascular:  Antilipid - Statins Failed - 05/24/2022  2:42 PM      Failed - Lipid Panel in normal range within the last 12 months    Cholesterol, Total  Date Value Ref Range Status  01/04/2022 220 (H) 100 - 199 mg/dL Final   LDL Chol Calc (NIH)  Date Value Ref Range Status  01/04/2022 127 (H) 0 - 99 mg/dL Final   HDL  Date Value Ref Range Status  01/04/2022 73 >39 mg/dL Final   Triglycerides  Date Value Ref Range Status  01/04/2022 116 0 - 149 mg/dL Final         Passed - Patient is not pregnant      Passed - Valid encounter within last 12 months    Recent Outpatient Visits           4 months ago [place], Donald E, MD   1 year ago Hyperlipidemia, mixed   [facility] Birdie Sons, MD   2 years ago Acute left-sided low back pain, unspecified whether sciatica present   [facility] Birdie Sons, MD   3 years ago Hyperlipidemia, mixed   [facility] Birdie Sons, MD   4 years ago Cough   [facility] Birdie Sons, MD

## 2022-05-24 NOTE — Telephone Encounter (Signed)
Copied from [place] [id number]. Topic: General - Other >> May 24, 2022  1:57 PM Everette C wrote: Reason for CRM: Medication Refill - Medication: atorvastatin (LIPITOR) 20 MG tablet [[id number]]  Has the patient contacted their pharmacy? Yes.   (Agent: If no, request that the patient contact the pharmacy for the refill. If patient does not wish to contact the pharmacy document the reason why and proceed with request.) (Agent: If yes, when and what did the pharmacy advise?)  Preferred Pharmacy (with phone number or street name): [place], [place] [address] Phone: [phone] Fax: [phone] Hours: Not open 24 hours   Has the patient been seen for an appointment in the last year OR does the patient have an upcoming appointment? Yes.    Agent: Please be advised that RX refills may take up to 3 business days. We ask that you follow-up with your pharmacy.

## 2022-06-21 ENCOUNTER — Other Ambulatory Visit: Payer: Self-pay | Admitting: Family Medicine

## 2022-06-21 NOTE — Telephone Encounter (Signed)
Dosage changed to 20mg . Requested Prescriptions  Pending Prescriptions Disp Refills   atorvastatin (LIPITOR) 40 MG tablet [Pharmacy Med Name: ATORVASTATIN 40 MG TABLET] 90 tablet 0    Sig: Take 1 tablet (20 mg total) by mouth daily.     Cardiovascular:  Antilipid - Statins Failed - 06/21/2022  2:43 PM      Failed - Lipid Panel in normal range within the last 12 months    Cholesterol, Total  Date Value Ref Range Status  01/04/2022 220 (H) 100 - 199 mg/dL Final   LDL Chol Calc (NIH)  Date Value Ref Range Status  01/04/2022 127 (H) 0 - 99 mg/dL Final   HDL  Date Value Ref Range Status  01/04/2022 73 >39 mg/dL Final   Triglycerides  Date Value Ref Range Status  01/04/2022 116 0 - 149 mg/dL Final         Passed - Patient is not pregnant      Passed - Valid encounter within last 12 months    Recent Outpatient Visits           5 months ago [place], Donald E, MD   1 year ago Hyperlipidemia, mixed   [place], Donald E, MD   3 years ago Acute left-sided low back pain, unspecified whether sciatica present   [facility] Birdie Sons, MD   3 years ago Hyperlipidemia, mixed   [facility] Birdie Sons, MD   4 years ago Cough   [facility] Birdie Sons, MD

## 2022-07-04 ENCOUNTER — Other Ambulatory Visit: Payer: Self-pay | Admitting: Family Medicine

## 2022-07-04 NOTE — Telephone Encounter (Signed)
Requested medication (s) are due for refill today: yes  Requested medication (s) are on the active medication list: yes  Last refill:  01/22/22  Future visit scheduled: no  Notes to clinic:  Unable to refill per protocol, cannot delegate.      Requested Prescriptions  Pending Prescriptions Disp Refills   QUEtiapine (SEROQUEL) 25 MG tablet 30 tablet 5    Sig: Take 1 tablet (25 mg total) by mouth at bedtime as needed.     Not Delegated - Psychiatry:  Antipsychotics - Second Generation (Atypical) - quetiapine Failed - 07/04/2022  2:52 PM      Failed - This refill cannot be delegated      Failed - TSH in normal range and within 360 days    TSH  Date Value Ref Range Status  08/08/2020 1.640 0.450 - 4.500 uIU/mL Final         Failed - Last BP in normal range    BP Readings from Last 1 Encounters:  01/04/22 (!) 152/82         Failed - Valid encounter within last 6 months    Recent Outpatient Visits           6 months ago [place], Donald E, MD   1 year ago Hyperlipidemia, mixed   [place], Donald E, MD   3 years ago Acute left-sided low back pain, unspecified whether sciatica present   [facility] Birdie Sons, MD   3 years ago Hyperlipidemia, mixed   [place], Donald E, MD   4 years ago Cough   [place], Donald E, MD              Failed - Lipid Panel in normal range within the last 12 months    Cholesterol, Total  Date Value Ref Range Status  01/04/2022 220 (H) 100 - 199 mg/dL Final   LDL Chol Calc (NIH)  Date Value Ref Range Status  01/04/2022 127 (H) 0 - 99 mg/dL Final   HDL  Date Value Ref Range Status  01/04/2022 73 >39 mg/dL Final   Triglycerides  Date Value Ref Range Status  01/04/2022 116 0 - 149 mg/dL Final         Failed - CBC within normal limits and  completed in the last 12 months    No results found for: "WBC", "[place]" No results found for: "RBC", "RBCKUC" No results found for: "HGB", "HGBKUC", "[place]", "HGBOTHER", "TOTHGB", "HGBPLASMA", "LABHEMOF" No results found for: "HCT", "HCTKUC", "SRHCT" No results found for: "MCHC", "[place]" No results found for: "MCH", "[place]" No results found for: "MCVKUC", "MCV" No results found for: "PLTCOUNTKUC", "LABPLAT", "POCPLA" No results found for: "RDW", "[place]", "POCRDW"       Passed - Completed PHQ-2 or PHQ-9 in the last 360 days      Passed - Last Heart Rate in normal range    Pulse Readings from Last 1 Encounters:  01/04/22 86         Passed - CMP within normal limits and completed in the last 12 months    Albumin  Date Value Ref Range Status  01/04/2022 4.6 3.8 - 4.9 g/dL Final   Alkaline Phosphatase  Date Value Ref Range Status  01/04/2022 79 44 - 121 IU/L Final   ALT  Date Value Ref Range Status  01/04/2022 44 (H) 0 - 32  IU/L Final   AST  Date Value Ref Range Status  01/04/2022 28 0 - 40 IU/L Final   BUN  Date Value Ref Range Status  01/04/2022 15 6 - 24 mg/dL Final   Calcium  Date Value Ref Range Status  01/04/2022 9.1 8.7 - 10.2 mg/dL Final   CO2  Date Value Ref Range Status  01/04/2022 23 20 - 29 mmol/L Final   Creatinine, Ser  Date Value Ref Range Status  01/04/2022 0.67 0.57 - 1.00 mg/dL Final   Glucose  Date Value Ref Range Status  01/04/2022 116 (H) 70 - 99 mg/dL Final  08/18/2014 111 mg/dL Final   Potassium  Date Value Ref Range Status  01/04/2022 4.1 3.5 - 5.2 mmol/L Final   Sodium  Date Value Ref Range Status  01/04/2022 143 134 - 144 mmol/L Final   Bilirubin Total  Date Value Ref Range Status  01/04/2022 0.7 0.0 - 1.2 mg/dL Final   Protein, UA  Date Value Ref Range Status  06/21/2019 Negative Negative Final   Total Protein  Date Value Ref Range Status  01/04/2022 6.7 6.0 - 8.5 g/dL Final   GFR calc Af Amer  Date Value Ref  Range Status  02/25/2019 105 >59 mL/min/1.73 Final   eGFR  Date Value Ref Range Status  01/04/2022 101 >59 mL/min/1.73 Final   GFR calc non Af Amer  Date Value Ref Range Status  02/25/2019 91 >59 mL/min/1.73 Final

## 2022-07-04 NOTE — Telephone Encounter (Signed)
Medication Refill - Medication: QUEtiapine (SEROQUEL) 25 MG tablet   Has the patient contacted their pharmacy? Yes.    Pharmacy advised they sent two refill request to PCP.   Preferred Pharmacy (with phone number or street name):  [place], [place] Phone: [phone]  Fax: [phone]     Has the patient been seen for an appointment in the last year OR does the patient have an upcoming appointment? Yes.    Agent: Please be advised that RX refills may take up to 3 business days. We ask that you follow-up with your pharmacy.

## 2022-07-05 MED ORDER — QUETIAPINE FUMARATE 25 MG PO TABS: 25.0000 mg | ORAL_TABLET | Freq: Every evening | ORAL | 4 refills | Status: DC | PRN

## 2022-10-23 ENCOUNTER — Other Ambulatory Visit: Payer: Self-pay | Admitting: Family Medicine

## 2022-10-23 DIAGNOSIS — F419 Anxiety disorder, unspecified: Secondary | ICD-10-CM

## 2022-12-19 ENCOUNTER — Other Ambulatory Visit: Payer: Self-pay | Admitting: Family Medicine

## 2022-12-19 NOTE — Telephone Encounter (Signed)
Medication Refill - Medication: atorvastatin (LIPITOR) 20 MG tablet   Has the patient contacted their pharmacy? No. (Agent: If no, request that the patient contact the pharmacy for the refill. If patient does not wish to contact the pharmacy document the reason why and proceed with request.) (Agent: If yes, when and what did the pharmacy advise?)  Preferred Pharmacy (with phone number or street name):  [facility] [place], [place] - [address] Phone: [phone]  Fax: [phone]     Has the patient been seen for an appointment in the last year OR does the patient have an upcoming appointment? Yes.    Agent: Please be advised that RX refills may take up to 3 business days. We ask that you follow-up with your pharmacy.

## 2022-12-20 ENCOUNTER — Other Ambulatory Visit: Payer: Self-pay

## 2022-12-20 ENCOUNTER — Encounter: Payer: Self-pay | Admitting: *Deleted

## 2022-12-20 MED ORDER — ATORVASTATIN CALCIUM 20 MG PO TABS: 20.0000 mg | ORAL_TABLET | Freq: Every day | ORAL | 0 refills | Status: DC

## 2022-12-20 NOTE — Telephone Encounter (Signed)
Labs in date but physical due at the end of August 2024.  MyChart message sent to contact office for an appt.  30 day courtesy supply given.  Requested Prescriptions  Pending Prescriptions Disp Refills   atorvastatin (LIPITOR) 20 MG tablet 30 tablet 0    Sig: Take 1 tablet (20 mg total) by mouth daily.     Cardiovascular:  Antilipid - Statins Failed - 12/19/2022 12:39 PM      Failed - Lipid Panel in normal range within the last 12 months    Cholesterol, Total  Date Value Ref Range Status  01/04/2022 220 (H) 100 - 199 mg/dL Final   LDL Chol Calc (NIH)  Date Value Ref Range Status  01/04/2022 127 (H) 0 - 99 mg/dL Final   HDL  Date Value Ref Range Status  01/04/2022 73 >39 mg/dL Final   Triglycerides  Date Value Ref Range Status  01/04/2022 116 0 - 149 mg/dL Final         Passed - Patient is not pregnant      Passed - Valid encounter within last 12 months    Recent Outpatient Visits           11 months ago Anxiety   [place] [facility] Malva Limes, MD   2 years ago Hyperlipidemia, mixed   [facility] Malva Limes, MD   3 years ago Acute left-sided low back pain, unspecified whether sciatica present   [facility] Malva Limes, MD   3 years ago Hyperlipidemia, mixed   [facility] Malva Limes, MD   5 years ago Cough   [facility] Malva Limes, MD

## 2023-02-11 ENCOUNTER — Ambulatory Visit (INDEPENDENT_AMBULATORY_CARE_PROVIDER_SITE_OTHER): Payer: 59 | Admitting: Family Medicine

## 2023-02-11 VITALS — BP 146/78 | HR 82 | Temp 98.6°F | Resp 16 | Ht 65.0 in | Wt 264.0 lb

## 2023-02-11 DIAGNOSIS — F419 Anxiety disorder, unspecified: Secondary | ICD-10-CM | POA: Diagnosis not present

## 2023-02-11 DIAGNOSIS — E782 Mixed hyperlipidemia: Secondary | ICD-10-CM

## 2023-02-11 DIAGNOSIS — M170 Bilateral primary osteoarthritis of knee: Secondary | ICD-10-CM | POA: Diagnosis not present

## 2023-02-11 DIAGNOSIS — F5101 Primary insomnia: Secondary | ICD-10-CM

## 2023-02-11 MED ORDER — LORAZEPAM 2 MG PO TABS: 2.0000 mg | ORAL_TABLET | Freq: Two times a day (BID) | ORAL | 1 refills | Status: DC

## 2023-02-11 MED ORDER — QUETIAPINE FUMARATE 25 MG PO TABS: 25.0000 mg | ORAL_TABLET | Freq: Every evening | ORAL | 4 refills | Status: DC | PRN

## 2023-02-11 MED ORDER — MELOXICAM 15 MG PO TABS: 15.0000 mg | ORAL_TABLET | Freq: Every day | ORAL | 0 refills | Status: DC

## 2023-02-11 MED ORDER — ATORVASTATIN CALCIUM 20 MG PO TABS: 20.0000 mg | ORAL_TABLET | Freq: Every day | ORAL | 0 refills | Status: DC

## 2023-02-11 NOTE — Progress Notes (Signed)
Established patient visit   Patient: Judith Palmer   DOB: 02-23-1961   62 y.o. Female  MRN: [id number] Visit Date: 02/11/2023  Today's healthcare provider: Mila Merry, MD   Chief Complaint  Patient presents with   Medication Management    Patient states that she is in need of refills of all her medications.     Joint Pain    Patient has changed jobs.  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  She has complaint of pan and stiffness in multiple joint sites.  Mainly bilateral knees but also fingers and back pain.  States she had to go back to using Advil as the Aleve did not help.  She worries she is taking too many.   Hyperlipidemia    Patient needs refill on Atorvastatin and also levels have not been checked since 2023   Subjective    HPI HPI     Medication Management    Additional comments: Patient states that she is in need of refills of all her medications.          Joint Pain    Additional comments: Patient has changed jobs.  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  She has complaint of pan and stiffness in multiple joint sites.  Mainly bilateral knees but also fingers and back pain.  States she had to go back to using Advil as the Aleve did not help.  She worries she is taking too many.        Hyperlipidemia    Additional comments: Patient needs refill on Atorvastatin and also levels have not been checked since 2023      Last edited by Adline Peals, CMA on 02/11/2023  9:50 AM.      Has been out of atorvastatin for 2 weeks.   She reports she takes lorazepam twice a day every day for nerves, and it continues to work well, she still needs to take Seroquel at night to help her sleep which remains effective.    Medications: Outpatient Medications Prior to Visit  Medication Sig   atorvastatin (LIPITOR) 20 MG tablet Take 1 tablet (20 mg total) by mouth daily.   LORazepam (ATIVAN) 2 MG tablet Take  1 tablet (2 mg total) by mouth 2 (two) times daily.   QUEtiapine (SEROQUEL) 25 MG tablet Take 1 tablet (25 mg total) by mouth at bedtime as needed.   No facility-administered medications prior to visit.    Review of Systems     Objective    BP (!) 146/78 (BP Location: Right Arm, Patient Position: Sitting, Cuff Size: Large)   Pulse 82   Temp 98.6 F (37 C) (Oral)   Resp 16   Ht 5\' 5"  (1.651 m)   Wt 264 lb (119.7 kg)   BMI 43.93 kg/m    Physical Exam  General appearance: Obese female, cooperative and in no acute distress Head: Normocephalic, without obvious abnormality, atraumatic Respiratory: Respirations even and unlabored, normal respiratory rate Extremities: All extremities are intact. No erythema or effusions of knees, mild tenderness along joint line. Skin: Skin color, texture, turgor normal. No rashes seen  Psych: Appropriate mood and affect. Neurologic: Mental status: Alert, oriented to person, place, and time, thought content appropriate.   Assessment & Plan     1. Primary osteoarthritis of both knees Encourage regular walking and work on losing weight.  -  meloxicam (MOBIC) 15 MG tablet; Take 1 tablet (15 mg total) by mouth daily.  Dispense: 30 tablet; Refill: 0   2. Anxiety Well controlled.  refill LORazepam (ATIVAN) 2 MG tablet; Take 1 tablet (2 mg total) by mouth 2 (two) times daily.  Dispense: 60 tablet; Refill: 1  3. Hyperlipidemia, mixed She is tolerating atorvastatin well with no adverse effects.  Ran out two weeks ago, will get back on medication for at least two weeks before having following labs done.  - Comprehensive metabolic panel - Lipid panel  4. Primary insomnia Refill QUEtiapine (SEROQUEL) 25 MG tablet; Take 1 tablet (25 mg total) by mouth at bedtime as needed.  Dispense: 30 tablet; Refill: 4         Mila Merry, MD  [facility] [phone] (phone) [phone] (fax)  [facility]

## 2023-03-01 LAB — LIPID PANEL
Chol/HDL Ratio: 3 {ratio} (ref 0.0–4.4)
Cholesterol, Total: 200 mg/dL — ABNORMAL HIGH (ref 100–199)
HDL: 66 mg/dL (ref >39)
LDL Chol Calc (NIH): 113 mg/dL — ABNORMAL HIGH (ref 0–99)
Triglycerides: 122 mg/dL (ref 0–149)
VLDL Cholesterol Cal: 21 mg/dL (ref 5–40)

## 2023-03-01 LAB — COMPREHENSIVE METABOLIC PANEL
ALT: 24 [IU]/L (ref 0–32)
AST: 17 [IU]/L (ref 0–40)
Albumin: 4.4 g/dL (ref 3.8–4.9)
Alkaline Phosphatase: 75 [IU]/L (ref 44–121)
BUN/Creatinine Ratio: 28 (ref 12–28)
BUN: 19 mg/dL (ref 8–27)
Bilirubin Total: 0.6 mg/dL (ref 0.0–1.2)
CO2: 21 mmol/L (ref 20–29)
Calcium: 9.4 mg/dL (ref 8.7–10.3)
Chloride: 103 mmol/L (ref 96–106)
Creatinine, Ser: 0.67 mg/dL (ref 0.57–1.00)
Globulin, Total: 2.3 g/dL (ref 1.5–4.5)
Glucose: 100 mg/dL — ABNORMAL HIGH (ref 70–99)
Potassium: 3.9 mmol/L (ref 3.5–5.2)
Sodium: 141 mmol/L (ref 134–144)
Total Protein: 6.7 g/dL (ref 6.0–8.5)
eGFR: 100 mL/min/{1.73_m2} (ref >59)

## 2023-03-24 ENCOUNTER — Other Ambulatory Visit: Payer: Self-pay | Admitting: Family Medicine

## 2023-05-16 ENCOUNTER — Other Ambulatory Visit: Payer: Self-pay | Admitting: Family Medicine

## 2023-05-16 DIAGNOSIS — F419 Anxiety disorder, unspecified: Secondary | ICD-10-CM

## 2023-05-19 ENCOUNTER — Other Ambulatory Visit: Payer: Self-pay | Admitting: Family Medicine

## 2023-05-19 MED ORDER — ATORVASTATIN CALCIUM 20 MG PO TABS: 20.0000 mg | ORAL_TABLET | Freq: Every day | ORAL | 0 refills | Status: AC

## 2023-05-19 NOTE — Telephone Encounter (Signed)
Please advise 

## 2023-08-19 ENCOUNTER — Emergency Department
Admission: EM | Admit: 2023-08-19 | Discharge: 2023-08-19 | Disposition: A | Discharge status: Home / Self Care | Attending: Emergency Medicine | Admitting: Emergency Medicine

## 2023-08-19 ENCOUNTER — Other Ambulatory Visit: Payer: Self-pay

## 2023-08-19 DIAGNOSIS — L509 Urticaria, unspecified: Secondary | ICD-10-CM | POA: Insufficient documentation

## 2023-08-19 DIAGNOSIS — S3991XA Unspecified injury of abdomen, initial encounter: Secondary | ICD-10-CM | POA: Diagnosis present

## 2023-08-19 DIAGNOSIS — W57XXXA Bitten or stung by nonvenomous insect and other nonvenomous arthropods, initial encounter: Secondary | ICD-10-CM | POA: Insufficient documentation

## 2023-08-19 DIAGNOSIS — S30861A Insect bite (nonvenomous) of abdominal wall, initial encounter: Secondary | ICD-10-CM | POA: Insufficient documentation

## 2023-08-19 MED ORDER — DIPHENHYDRAMINE HCL 25 MG PO CAPS
75.0000 mg | ORAL_CAPSULE | Freq: Once | ORAL | Status: AC
  Administered 2023-08-19: 75 mg via ORAL
  Filled 2023-08-19: qty 3

## 2023-08-19 MED ORDER — DOXYCYCLINE HYCLATE 100 MG PO TABS
100.0000 mg | ORAL_TABLET | Freq: Two times a day (BID) | ORAL | 0 refills | Status: DC
Start: 2023-08-19 — End: 2023-08-26

## 2023-08-19 MED ORDER — DOXYCYCLINE HYCLATE 100 MG PO TABS
100.0000 mg | ORAL_TABLET | Freq: Once | ORAL | Status: AC
  Administered 2023-08-19: 100 mg via ORAL
  Filled 2023-08-19: qty 1

## 2023-08-19 MED ORDER — DEXAMETHASONE SODIUM PHOSPHATE 10 MG/ML IJ SOLN
10.0000 mg | Freq: Once | INTRAMUSCULAR | Status: AC
  Administered 2023-08-19: 10 mg via INTRAMUSCULAR
  Filled 2023-08-19: qty 1

## 2023-08-19 NOTE — Discharge Instructions (Signed)
 As we discussed, a course of doxycycline antibiotic twice daily for 7 days due to your tick bite to ensure we treat tickborne diseases like Lyme disease and [facility] spotted fever.  Regarding the itchy rash, this is consistent with hives.  This is best treated with antihistamines like Benadryl - take 2-3 tablets (50-75 mg) per dose up to 3-4 times per day as needed.   You can also supplement with medications like Zyrtec/cetirizine in addition to the Benadryl, once daily.  The symptoms may come and go over the next few days requiring repeated doses of Benadryl.  If your symptoms worsen despite these measures then please return to the ED

## 2023-08-19 NOTE — ED Provider Notes (Signed)
 [facility] Provider Note    Event Date/Time   First MD Initiated Contact with Patient 08/19/23 [phone]     (approximate)   History   Allergic Reaction   HPI  Judith Palmer is a 63 y.o. female who presents to the ED for evaluation of Allergic Reaction   I review a routine PCP visit from September.  Obese patient with history of HLD, arthritis, anxiety  Morbidly obese patient presents to the ED for evaluation of diffuse pruritic rash as well as removing a tick from herself over the past couple days.  She was outside working in the yard over this past weekend, symptoms started last night about 12-15 hours ago and have been persistent despite 50 mg of Benadryl she took 12 hours ago.  Reports diffuse pruritic raised rash throughout much of her body, particularly her trunk, back of her neck and her scalp.  Also reports removing an attached tick from her abdominal wall around the same timeframe.   Physical Exam   Triage Vital Signs: ED Triage Vitals  Encounter Vitals Group     BP 08/19/23 0533 (!) 157/93     Systolic BP Percentile --      Diastolic BP Percentile --      Pulse Rate 08/19/23 0533 (!) 115     Resp 08/19/23 0533 20     Temp 08/19/23 0533 98.7 F (37.1 C)     Temp Source 08/19/23 0533 Oral     SpO2 08/19/23 0533 96 %     Weight --      Height --      Head Circumference --      Peak Flow --      Pain Score 08/19/23 0534 0     Pain Loc --      Pain Education --      Exclude from Growth Chart --     Most recent vital signs: Vitals:   08/19/23 0533  BP: (!) 157/93  Pulse: (!) 115  Resp: 20  Temp: 98.7 F (37.1 C)  SpO2: 96%    General: Awake, no distress.  No signs of anaphylaxis beyond simple hives diffusely CV:  Good peripheral perfusion.  Resp:  Normal effort.  Abd:  No distention.  MSK:  No deformity noted.  Neuro:  No focal deficits appreciated. Other:  Diffuse hives throughout much of her body not involving palms or  soles. She shows me where she removed the tick, abdominal wall anteriorly between pubis and umbilicus around the baseline, but I do not visualize any particular targetoid lesion or rash at this site.  No other residual ticks or tick components.   ED Results / Procedures / Treatments   Labs (all labs ordered are listed, but only abnormal results are displayed) Labs Reviewed - No data to display  EKG   RADIOLOGY   Official radiology report(s): No results found.  PROCEDURES and INTERVENTIONS:  Procedures  Medications  dexamethasone (DECADRON) injection 10 mg (10 mg Intramuscular Given 08/19/23 0739)  diphenhydrAMINE (BENADRYL) capsule 75 mg (75 mg Oral Given 08/19/23 0739)  doxycycline (VIBRA-TABS) tablet 100 mg (100 mg Oral Given 08/19/23 0739)     IMPRESSION / MDM / ASSESSMENT AND PLAN / ED COURSE  I reviewed the triage vital signs and the nursing notes.  Differential diagnosis includes, but is not limited to, anaphylaxis, urticaria, atopic reaction, Lyme disease, RMSF, drug reaction  {Patient presents with symptoms of an acute illness or injury that is  potentially life-threatening.  63 year old patient presents with signs of diffuse hives without anaphylaxis.  Look systemically well.  Triage tachycardia is noted.  Difficult to say if the tissue removed from her abdominal wall is related to these symptoms, but she does not know how long it was attached and discussing with the patient I agree that a course of doxycycline would be prudent.  Will give her a dose of intramuscular Decadron and started on a course of antihistamines due to her hives.  Suspect she will be suitable for outpatient management.  No indication for EpiPen at this point.  Clinical Course as of 08/19/23 0816  Tue Aug 19, 2023  0816 Reassessed.  Started feel little bit better and wants to go ahead and go home, think this is reasonable.  We again reviewed care at home and ED return precautions.  Answered questions.  [DS]    Clinical Course User Index [DS] Delton Prairie, MD     FINAL CLINICAL IMPRESSION(S) / ED DIAGNOSES   Final diagnoses:  Hives  Tick bite of abdominal wall, initial encounter     Rx / DC Orders   ED Discharge Orders          Ordered    doxycycline (VIBRA-TABS) 100 MG tablet  2 times daily        08/19/23 0737             Note:  This document was prepared using Dragon voice recognition software and may include unintentional dictation errors.   Delton Prairie, MD 08/19/23 [phone]

## 2023-08-19 NOTE — ED Notes (Signed)
 Redness has improved on face.

## 2023-08-19 NOTE — ED Triage Notes (Addendum)
 Pt presents with a body wide rash. Pt reports the rash started Monday morning on the back of her neck. Pt has swelling noted to the back of her head, the rash has now spread all over her body . Pt reports rash being itchy. Pt feels like she is having difficulty rotating her head due to swelling behind her head/neck.  Pt denies any difficulty breathing. No swelling of tongue/throat.  Pt found a tick below her belly button Monday evening.  Pt took benadryl without relief of symptoms.

## 2023-08-20 ENCOUNTER — Encounter: Payer: Self-pay | Admitting: Emergency Medicine

## 2023-08-20 ENCOUNTER — Ambulatory Visit: Admission: EM | Admit: 2023-08-20 | Discharge: 2023-08-20 | Disposition: A | Discharge status: Home / Self Care

## 2023-08-20 ENCOUNTER — Telehealth: Payer: Self-pay

## 2023-08-20 ENCOUNTER — Ambulatory Visit: Payer: Self-pay | Admitting: *Deleted

## 2023-08-20 DIAGNOSIS — L509 Urticaria, unspecified: Secondary | ICD-10-CM | POA: Diagnosis not present

## 2023-08-20 MED ORDER — HYDROXYZINE HCL 50 MG PO TABS: 50.0000 mg | ORAL_TABLET | Freq: Four times a day (QID) | ORAL | 0 refills | Status: AC | PRN

## 2023-08-20 MED ORDER — PREDNISONE 10 MG PO TABS: ORAL_TABLET | ORAL | 0 refills | Status: DC

## 2023-08-20 MED ORDER — FAMOTIDINE 20 MG PO TABS: 20.0000 mg | ORAL_TABLET | Freq: Two times a day (BID) | ORAL | 0 refills | Status: AC

## 2023-08-20 NOTE — ED Triage Notes (Signed)
 Pt states she has a rash all over and swelling in her face and back of her neck 3 days. She went to the ER yesterday and a tick was found on her. She was treated with doxycyline, benadryl and dexamethasone. Pt states the swelling or the rash is not going away.

## 2023-08-20 NOTE — ED Provider Notes (Signed)
 [facility]    CSN: [id number] Arrival date & time: 08/20/23  1631      History   Chief Complaint Chief Complaint  Patient presents with   Rash    HPI Judith Palmer is a 63 y.o. female presenting for 2-day history of hives.  Patient says symptoms started after she was working outside.  She went to the ED yesterday morning and a tick was found on her.  She was given IM dexamethasone and Benadryl and prescribed doxycycline.  She was advised to take Benadryl at home, 75 mg 3 times daily.  Has been doing that and states she will have hives come and go.  No real improvement in symptoms.  Denies any worsening of symptoms.  No associated mouth swelling, difficulty swallowing, throat tightness, chest tightness or shortness of breath.  Has had some swelling of her right upper eyelid and forehead today.  No contact with any known allergens.  No new meds besides doxycycline but she was already having the symptoms before beginning the antibiotic.  HPI  Past Medical History:  Diagnosis Date   Hyperlipidemia     Patient Active Problem List   Diagnosis Date Noted   Transient elevated blood pressure 05/10/2015   Hyperlipidemia, mixed 05/10/2015   Ganglion cyst 05/10/2015   Menopause 05/10/2015   Fam hx-ischem heart disease 12/13/2008   Breast lump 04/08/2006   Esophageal reflux 01/15/2006   Insomnia 01/14/2006   Depression 05/20/2005   Anxiety 05/20/1998    History reviewed. No pertinent surgical history.  OB History     Gravida  0   Para  0   Term  0   Preterm  0   AB  0   Living  0      SAB  0   IAB  0   Ectopic  0   Multiple  0   Live Births               Home Medications    Prior to Admission medications   Medication Sig Start Date End Date Taking? Authorizing Provider  doxycycline (VIBRAMYCIN) 100 MG capsule Take 100 mg by mouth 2 (two) times daily. 08/19/23  Yes [provider]  famotidine (PEPCID) 20 MG tablet Take 1 tablet (20  mg total) by mouth 2 (two) times daily for 10 days. 08/20/23 08/30/23 Yes Shirlee Latch, PA-C  hydrOXYzine (ATARAX) 50 MG tablet Take 1 tablet (50 mg total) by mouth every 6 (six) hours as needed for up to 10 days (rash). 08/20/23 08/30/23 Yes Shirlee Latch, PA-C  predniSONE (DELTASONE) 10 MG tablet 5 tabs x 2 days, 4 tabs x 2 days, 3 tabs x 2 days, 2 tabs x 2 days, 1 tab x 2 days 08/20/23  Yes Eusebio Friendly B, PA-C  atorvastatin (LIPITOR) 20 MG tablet Take 1 tablet (20 mg total) by mouth daily. 05/19/23   Malva Limes, MD  atorvastatin (LIPITOR) 20 MG tablet Take 1 tablet (20 mg total) by mouth daily. 05/19/23   Malva Limes, MD  LORazepam (ATIVAN) 2 MG tablet Take 1 tablet (2 mg total) by mouth 2 (two) times daily. 05/19/23   Malva Limes, MD  meloxicam (MOBIC) 15 MG tablet Take 1 tablet (15 mg total) by mouth daily. 03/24/23   Malva Limes, MD  QUEtiapine (SEROQUEL) 25 MG tablet Take 1 tablet (25 mg total) by mouth at bedtime as needed. 02/11/23   Malva Limes, MD  Family History Family History  Problem Relation Age of Onset   Congestive Heart Failure Mother    Hyperlipidemia Mother    Heart attack Father    Hyperlipidemia Brother     Social History Social History   Tobacco Use   Smoking status: Former   Smokeless tobacco: Never  Vaping Use   Vaping status: Never Used  Substance Use Topics   Alcohol use: Yes    Alcohol/week: 0.0 standard drinks of alcohol    Comment: 1-2 glasses of wine per month   Drug use: No     Allergies   Codeine   Review of Systems Review of Systems  Constitutional:  Negative for fatigue.  HENT:  Positive for facial swelling. Negative for sore throat, trouble swallowing and voice change.   Eyes:  Negative for visual disturbance.  Respiratory:  Negative for chest tightness, shortness of breath and wheezing.   Cardiovascular:  Negative for palpitations.  Gastrointestinal:  Negative for nausea and vomiting.  Skin:  Positive for  rash.  Neurological:  Negative for dizziness, weakness and headaches.     Physical Exam Triage Vital Signs ED Triage Vitals  Encounter Vitals Group     BP 08/20/23 1647 139/85     Systolic BP Percentile --      Diastolic BP Percentile --      Pulse Rate 08/20/23 1647 (!) 102     Resp 08/20/23 1647 16     Temp 08/20/23 1647 98.3 F (36.8 C)     Temp src --      SpO2 08/20/23 1647 95 %     Weight --      Height --      Head Circumference --      Peak Flow --      Pain Score 08/20/23 1645 0     Pain Loc --      Pain Education --      Exclude from Growth Chart --    No data found.  Updated Vital Signs BP 139/85 (BP Location: Right Arm)   Pulse (!) 102   Temp 98.3 F (36.8 C)   Resp 16   LMP 02/08/2015   SpO2 95%   Physical Exam Vitals and nursing note reviewed.  Constitutional:      General: She is not in acute distress.    Appearance: Normal appearance. She is not ill-appearing or toxic-appearing.  HENT:     Head: Normocephalic and atraumatic.     Nose: Nose normal.     Mouth/Throat:     Mouth: Mucous membranes are moist.     Pharynx: Oropharynx is clear.  Eyes:     General: No scleral icterus.       Right eye: No discharge.        Left eye: No discharge.     Conjunctiva/sclera: Conjunctivae normal.  Cardiovascular:     Rate and Rhythm: Normal rate and regular rhythm.     Heart sounds: Normal heart sounds.  Pulmonary:     Effort: Pulmonary effort is normal. No respiratory distress.     Breath sounds: Normal breath sounds.  Musculoskeletal:     Cervical back: Neck supple.  Skin:    General: Skin is dry.     Findings: Rash (generalized urticarial rash. Mild swelling of central forehead and right upper eyelid) present.  Neurological:     General: No focal deficit present.     Mental Status: She is alert. Mental status is at baseline.  Motor: No weakness.     Gait: Gait normal.  Psychiatric:        Mood and Affect: Mood normal.        Behavior:  Behavior normal.      UC Treatments / Results  Labs (all labs ordered are listed, but only abnormal results are displayed) Labs Reviewed - No data to display  EKG   Radiology No results found.  Procedures Procedures (including critical care time)  Medications Ordered in UC Medications - No data to display  Initial Impression / Assessment and Plan / UC Course  I have reviewed the triage vital signs and the nursing notes.  Pertinent labs & imaging results that were available during my care of the patient were reviewed by me and considered in my medical decision making (see chart for details).   63 year old female presents for urticarial rash which is generalized and has been for the past couple of days.  Seen in the ER yesterday morning and treated with dexamethasone and Benadryl.  She was prescribed doxycycline because a tick was found on her abdomen yesterday.  Reports continued intermittent hives.  No associated mouth swelling, throat tightness or difficulty breathing.  Offered dexamethasone in clinic but patient declines.  Will start her on prednisone taper.  Discontinue Benadryl and begin hydroxyzine.  Begin famotidine.  Cool compresses.  Continue Doxycycline for possible tickborne disease. Reviewed return and ER precautions.   Final Clinical Impressions(s) / UC Diagnoses   Final diagnoses:  Urticaria     Discharge Instructions      -Start prednisone taper and take for 10 days. - Begin use of hydroxyzine in place of Benadryl.  If you find it is not working as well and may switch back to Benadryl. - Also start famotidine antihistamine as prescribed. - Use cool compresses on most swollen areas. - If you are not getting some relief in the next 48 hours or symptoms worsen go back to the emergency department.    ED Prescriptions     Medication Sig Dispense Auth. Provider   predniSONE (DELTASONE) 10 MG tablet 5 tabs x 2 days, 4 tabs x 2 days, 3 tabs x 2 days, 2 tabs  x 2 days, 1 tab x 2 days 30 tablet Eusebio Friendly B, PA-C   hydrOXYzine (ATARAX) 50 MG tablet Take 1 tablet (50 mg total) by mouth every 6 (six) hours as needed for up to 10 days (rash). 40 tablet Eusebio Friendly B, PA-C   famotidine (PEPCID) 20 MG tablet Take 1 tablet (20 mg total) by mouth 2 (two) times daily for 10 days. 20 tablet Gareth Morgan      PDMP not reviewed this encounter.   Shirlee Latch, PA-C 08/20/23 1737

## 2023-08-20 NOTE — Telephone Encounter (Signed)
  Chief Complaint: widespread hives Symptoms: patient was seen and treated at ED for hives yesterday- patient reports not getting a lot better- and her face seems worse. Patient wants to get checked today Frequency: started Monday Pertinent Negatives: Patient denies SOB- breathing problems- " just doesn't feel right" Disposition: [] ED /[x] Urgent Care (no appt availability in office) / [] Appointment(In office/virtual)/ []  [facility]/ [] Home Care/ [] Refused Recommended Disposition /[] [facility]/ []  Follow-up with PCP Additional Notes: No open appointment- late in day- patient agrees UC would be next best option.    Copied from CRM [id number]. Topic: Clinical - Red Word Triage >> Aug 20, 2023  2:42 PM Geroge Baseman wrote: Red Word that prompted transfer to Nurse Triage:Patient is taking prescriptions that were sent over by her pcp but she said the swelling and itchiness has been increasing. She said her face is swelling above her eyes and temples. She said she is very shaky. Reason for Disposition  [1] MODERATE-SEVERE hives persist (i.e., hives interfere with normal activities or work) AND [2] taking antihistamine (e.g., Benadryl, Claritin) > 24 hours  Answer Assessment - Initial Assessment Questions 1. APPEARANCE: "What does the rash look like?"      Rash all over- face is swelling 2. LOCATION: "Where is the rash located?"      Face, torso, legs 3. NUMBER: "How many hives are there?"      multiple 4. SIZE: "How big are the hives?" (inches, cm, compare to coins) "Do they all look the same or is there lots of variation in shape and size?"      Red- hives- swelling 5. ONSET: "When did the hives begin?" (Hours or days ago)      Monday am 6. ITCHING: "Does it itch?" If Yes, ask: "How bad is the itch?"    - MILD: doesn't interfere with normal activities   - MODERATE-SEVERE: interferes with work, school, sleep, or other activities      severe 7. RECURRENT PROBLEM: "Have you had  hives before?" If Yes, ask: "When was the last time?" and "What happened that time?"      no 8. TRIGGERS: "Were you exposed to any new food, plant, cosmetic product or animal just before the hives began?"     ED is treating for tick bite exposure with antibiotic, benadryl, steriod injection 9. OTHER SYMPTOMS: "Do you have any other symptoms?" (e.g., fever, tongue swelling, difficulty breathing, abdomen pain)     Never had reaction- concerned about swelling on face-eye  Protocols used: Hives-A-AH

## 2023-08-20 NOTE — Discharge Instructions (Addendum)
-  Start prednisone taper and take for 10 days. - Begin use of hydroxyzine in place of Benadryl.  If you find it is not working as well and may switch back to Benadryl. - Also start famotidine antihistamine as prescribed. - Use cool compresses on most swollen areas. - If you are not getting some relief in the next 48 hours or symptoms worsen go back to the emergency department.

## 2023-08-20 NOTE — Telephone Encounter (Signed)
 Copied from CRM [id number]. Topic: Clinical - Medication Question >> Aug 20, 2023 10:21 AM Judith Palmer wrote: Reason for CRM: Patient went to ED yesterday for severe hives- still very itchy and uncomfortable, wants to know if there is anything that could be called in that is stronger than benedryl, hives are still spreading from head to toe- please call patient [phone]

## 2023-09-01 ENCOUNTER — Other Ambulatory Visit: Payer: Self-pay | Admitting: Family Medicine

## 2023-09-01 ENCOUNTER — Other Ambulatory Visit: Payer: Self-pay

## 2023-09-01 MED ORDER — MELOXICAM 15 MG PO TABS: 15.0000 mg | ORAL_TABLET | Freq: Every day | ORAL | 1 refills | Status: AC

## 2023-09-01 NOTE — Telephone Encounter (Signed)
 [facility] is requesting refill meloxicam (MOBIC) 15 MG tablet  Please advise

## 2023-09-01 NOTE — Telephone Encounter (Signed)
 LOV 02/11/23 NOV none LRF 03/24/23 30 x 1

## 2023-10-23 ENCOUNTER — Other Ambulatory Visit: Payer: Self-pay | Admitting: Family Medicine

## 2023-11-28 ENCOUNTER — Other Ambulatory Visit: Payer: Self-pay | Admitting: Family Medicine

## 2023-11-28 DIAGNOSIS — F419 Anxiety disorder, unspecified: Secondary | ICD-10-CM

## 2024-03-03 ENCOUNTER — Other Ambulatory Visit: Payer: Self-pay | Admitting: Family Medicine

## 2024-03-03 DIAGNOSIS — F419 Anxiety disorder, unspecified: Secondary | ICD-10-CM

## 2024-05-08 ENCOUNTER — Other Ambulatory Visit: Payer: Self-pay | Admitting: Family Medicine

## 2024-05-08 DIAGNOSIS — F419 Anxiety disorder, unspecified: Secondary | ICD-10-CM

## 2024-06-02 ENCOUNTER — Other Ambulatory Visit: Payer: Self-pay | Admitting: Family Medicine

## 2024-06-02 NOTE — Telephone Encounter (Unsigned)
 Copied from CRM [id number]. Topic: Clinical - Medication Refill >> Jun 02, 2024  1:52 PM Everette C wrote: Medication: QUEtiapine  (SEROQUEL ) 25 MG tablet [[id number]]  atorvastatin  (LIPITOR) 20 MG tablet [[id number]]   Has the patient contacted their pharmacy? Yes (Agent: If no, request that the patient contact the pharmacy for the refill. If patient does not wish to contact the pharmacy document the reason why and proceed with request.) (Agent: If yes, when and what did the pharmacy advise?)  This is the patient's preferred pharmacy:  [facility] - [place], [place] - [address] [place] [address] Phone: [phone] Fax: [phone]  Is this the correct pharmacy for this prescription? Yes If no, delete pharmacy and type the correct one.   Has the prescription been filled recently? Yes  Is the patient out of the medication? No  Has the patient been seen for an appointment in the last year OR does the patient have an upcoming appointment? Yes  Can we respond through MyChart? Yes  Agent: Please be advised that Rx refills may take up to 3 business days. We ask that you follow-up with your pharmacy.

## 2024-06-03 NOTE — Telephone Encounter (Signed)
 Requested medication (s) are due for refill today: Yes  Requested medication (s) are on the active medication list: Yes  Last refill:  Seroquel   03/03/24  Future visit scheduled: Yes  Notes to clinic:  Seroquel  not delegated.    Requested Prescriptions  Pending Prescriptions Disp Refills   QUEtiapine  (SEROQUEL ) 25 MG tablet 30 tablet 0    Sig: Take 1 tablet (25 mg total) by mouth at bedtime as needed.     Not Delegated - Psychiatry:  Antipsychotics - Second Generation (Atypical) - quetiapine  Failed - 06/03/2024  3:13 PM      Failed - This refill cannot be delegated      Failed - TSH in normal range and within 360 days    TSH  Date Value Ref Range Status  08/08/2020 1.640 0.450 - 4.500 uIU/mL Final         Failed - Completed PHQ-2 or PHQ-9 in the last 360 days      Failed - Valid encounter within last 6 months    Recent Outpatient Visits   None            Failed - Lipid Panel in normal range within the last 12 months    Cholesterol, Total  Date Value Ref Range Status  02/28/2023 200 (H) 100 - 199 mg/dL Final   LDL Chol Calc (NIH)  Date Value Ref Range Status  02/28/2023 113 (H) 0 - 99 mg/dL Final   HDL  Date Value Ref Range Status  02/28/2023 66 >39 mg/dL Final   Triglycerides  Date Value Ref Range Status  02/28/2023 122 0 - 149 mg/dL Final         Failed - CBC within normal limits and completed in the last 12 months    No results found for: WBC, WBCKUC No results found for: RBC, RBCKUC No results found for: HGB, HGBKUC, HGBPOCKUC, HGBOTHER, TOTHGB, HGBPLASMA, LABHEMOF No results found for: HCT, HCTKUC, SRHCT No results found for: MCHC, MCHCKUC No results found for: MCH, MCHKUC No results found for: MCVKUC, MCV No results found for: PLTCOUNTKUC, LABPLAT, POCPLA No results found for: RDW, RDWKUC, POCRDW       Failed - CMP within normal limits and completed in the last 12 months    Albumin  Date  Value Ref Range Status  02/28/2023 4.4 3.8 - 4.9 g/dL Final   Alkaline Phosphatase  Date Value Ref Range Status  02/28/2023 75 44 - 121 IU/L Final   ALT  Date Value Ref Range Status  02/28/2023 24 0 - 32 IU/L Final   AST  Date Value Ref Range Status  02/28/2023 17 0 - 40 IU/L Final   BUN  Date Value Ref Range Status  02/28/2023 19 8 - 27 mg/dL Final   Calcium   Date Value Ref Range Status  02/28/2023 9.4 8.7 - 10.3 mg/dL Final   CO2  Date Value Ref Range Status  02/28/2023 21 20 - 29 mmol/L Final   Creatinine, Ser  Date Value Ref Range Status  02/28/2023 0.67 0.57 - 1.00 mg/dL Final   Glucose  Date Value Ref Range Status  02/28/2023 100 (H) 70 - 99 mg/dL Final  96/68/7983 888 mg/dL Final   Potassium  Date Value Ref Range Status  02/28/2023 3.9 3.5 - 5.2 mmol/L Final   Sodium  Date Value Ref Range Status  02/28/2023 141 134 - 144 mmol/L Final   Bilirubin Total  Date Value Ref Range Status  02/28/2023 0.6 0.0 - 1.2 mg/dL Final  Protein, UA  Date Value Ref Range Status  06/21/2019 Negative Negative Final   Total Protein  Date Value Ref Range Status  02/28/2023 6.7 6.0 - 8.5 g/dL Final   GFR calc Af Amer  Date Value Ref Range Status  02/25/2019 105 >59 mL/min/1.73 Final   eGFR  Date Value Ref Range Status  02/28/2023 100 >59 mL/min/1.73 Final   GFR calc non Af Amer  Date Value Ref Range Status  02/25/2019 91 >59 mL/min/1.73 Final         Passed - Last BP in normal range    BP Readings from Last 1 Encounters:  08/20/23 139/85         Passed - Last Heart Rate in normal range    Pulse Readings from Last 1 Encounters:  08/20/23 (!) 102          atorvastatin  (LIPITOR) 20 MG tablet 90 tablet 3    Sig: Take 1 tablet (20 mg total) by mouth daily.     Cardiovascular:  Antilipid - Statins Failed - 06/03/2024  3:13 PM      Failed - Valid encounter within last 12 months    Recent Outpatient Visits   None            Failed - Lipid Panel in  normal range within the last 12 months    Cholesterol, Total  Date Value Ref Range Status  02/28/2023 200 (H) 100 - 199 mg/dL Final   LDL Chol Calc (NIH)  Date Value Ref Range Status  02/28/2023 113 (H) 0 - 99 mg/dL Final   HDL  Date Value Ref Range Status  02/28/2023 66 >39 mg/dL Final   Triglycerides  Date Value Ref Range Status  02/28/2023 122 0 - 149 mg/dL Final         Passed - Patient is not pregnant

## 2024-06-04 MED ORDER — QUETIAPINE FUMARATE 25 MG PO TABS: 25.0000 mg | ORAL_TABLET | Freq: Every evening | ORAL | 0 refills | Status: AC | PRN

## 2024-06-04 MED ORDER — ATORVASTATIN CALCIUM 20 MG PO TABS: 20.0000 mg | ORAL_TABLET | Freq: Every day | ORAL | 1 refills | Status: AC

## 2024-06-16 ENCOUNTER — Other Ambulatory Visit: Payer: Self-pay | Admitting: Family Medicine

## 2024-06-16 DIAGNOSIS — F419 Anxiety disorder, unspecified: Secondary | ICD-10-CM

## 2024-06-16 NOTE — Telephone Encounter (Unsigned)
 Copied from CRM [id number]. Topic: Clinical - Medication Refill >> Jun 16, 2024  1:15 PM Zebedee SAUNDERS wrote: Medication: LORazepam  (ATIVAN ) 2 MG tablet  Has the patient contacted their pharmacy? Yes (Agent: If no, request that the patient contact the pharmacy for the refill. If patient does not wish to contact the pharmacy document the reason why and proceed with request.) (Agent: If yes, when and what did the pharmacy advise?)  This is the patient's preferred pharmacy:  [facility] - [place], [place] - [address] [place] [address] Phone: [phone] Fax: [phone]  Is this the correct pharmacy for this prescription? Yes If no, delete pharmacy and type the correct one.   Has the prescription been filled recently? Yes  Is the patient out of the medication? Yes  Has the patient been seen for an appointment in the last year OR does the patient have an upcoming appointment? Yes  Can we respond through MyChart? Yes  Agent: Please be advised that Rx refills may take up to 3 business days. We ask that you follow-up with your pharmacy.

## 2024-06-17 NOTE — Telephone Encounter (Signed)
 Requested medications are due for refill today.  yes  Requested medications are on the active medications list.  yes  Last refill. 05/09/2024 #30 0 rf  Future visit scheduled.   yes  Notes to clinic.  Refill not delegated.     Requested Prescriptions  Pending Prescriptions Disp Refills   LORazepam  (ATIVAN ) 2 MG tablet 30 tablet 0    Sig: Take 1 tablet (2 mg total) by mouth 2 (two) times daily.     Not Delegated - Psychiatry: Anxiolytics/Hypnotics 2 Failed - 06/17/2024  2:03 PM      Failed - This refill cannot be delegated      Failed - Urine Drug Screen completed in last 360 days      Failed - Valid encounter within last 6 months    Recent Outpatient Visits   None            Passed - Patient is not pregnant

## 2024-06-22 ENCOUNTER — Other Ambulatory Visit: Payer: Self-pay | Admitting: Family Medicine

## 2024-06-22 DIAGNOSIS — F419 Anxiety disorder, unspecified: Secondary | ICD-10-CM

## 2024-07-12 ENCOUNTER — Ambulatory Visit: Admitting: Family Medicine
